# Patient Record
Sex: Female | Born: 1970
Health system: Southern US, Community
[De-identification: ages and names within clinical notes are randomized; demographics above are authoritative.]

## PROBLEM LIST (undated history)

## (undated) DIAGNOSIS — N189 Chronic kidney disease, unspecified: Secondary | ICD-10-CM

## (undated) DIAGNOSIS — R51 Headache: Secondary | ICD-10-CM

## (undated) DIAGNOSIS — R519 Headache, unspecified: Secondary | ICD-10-CM

## (undated) DIAGNOSIS — T7840XA Allergy, unspecified, initial encounter: Secondary | ICD-10-CM

## (undated) DIAGNOSIS — D649 Anemia, unspecified: Secondary | ICD-10-CM

## (undated) HISTORY — DX: Allergy, unspecified, initial encounter: T78.40XA

## (undated) HISTORY — PX: KIDNEY DONATION: SHX685

---

## 1999-09-01 ENCOUNTER — Emergency Department (HOSPITAL_COMMUNITY): Admission: EM | Admit: 1999-09-01 | Discharge: 1999-09-01 | Payer: Self-pay | Admitting: Emergency Medicine

## 2000-02-12 ENCOUNTER — Emergency Department (HOSPITAL_COMMUNITY): Admission: EM | Admit: 2000-02-12 | Discharge: 2000-02-12 | Payer: Self-pay | Admitting: Emergency Medicine

## 2000-02-12 ENCOUNTER — Encounter: Payer: Self-pay | Admitting: Emergency Medicine

## 2003-01-18 ENCOUNTER — Other Ambulatory Visit: Admission: RE | Admit: 2003-01-18 | Discharge: 2003-01-18 | Payer: Self-pay | Admitting: Obstetrics and Gynecology

## 2003-01-21 ENCOUNTER — Encounter: Payer: Self-pay | Admitting: Obstetrics and Gynecology

## 2003-01-21 ENCOUNTER — Ambulatory Visit (HOSPITAL_COMMUNITY): Admission: RE | Admit: 2003-01-21 | Discharge: 2003-01-21 | Payer: Self-pay | Admitting: Obstetrics and Gynecology

## 2004-07-18 ENCOUNTER — Emergency Department (HOSPITAL_COMMUNITY): Admission: EM | Admit: 2004-07-18 | Discharge: 2004-07-18 | Payer: Self-pay | Admitting: Emergency Medicine

## 2006-02-20 DIAGNOSIS — D229 Melanocytic nevi, unspecified: Secondary | ICD-10-CM

## 2006-02-20 HISTORY — DX: Melanocytic nevi, unspecified: D22.9

## 2008-08-31 ENCOUNTER — Emergency Department (HOSPITAL_COMMUNITY): Admission: EM | Admit: 2008-08-31 | Discharge: 2008-08-31 | Payer: Self-pay | Admitting: Emergency Medicine

## 2009-04-15 DIAGNOSIS — N189 Chronic kidney disease, unspecified: Secondary | ICD-10-CM

## 2009-04-15 HISTORY — DX: Chronic kidney disease, unspecified: N18.9

## 2010-03-22 ENCOUNTER — Encounter
Admission: RE | Admit: 2010-03-22 | Discharge: 2010-03-22 | Payer: Self-pay | Source: Home / Self Care | Attending: Family Medicine | Admitting: Family Medicine

## 2010-05-30 ENCOUNTER — Other Ambulatory Visit: Payer: Self-pay | Admitting: Family Medicine

## 2010-05-30 DIAGNOSIS — R911 Solitary pulmonary nodule: Secondary | ICD-10-CM

## 2010-06-20 ENCOUNTER — Ambulatory Visit
Admission: RE | Admit: 2010-06-20 | Discharge: 2010-06-20 | Disposition: A | Discharge status: Home / Self Care | Payer: No Typology Code available for payment source | Source: Ambulatory Visit | Attending: Family Medicine | Admitting: Family Medicine

## 2010-06-20 DIAGNOSIS — R911 Solitary pulmonary nodule: Secondary | ICD-10-CM

## 2013-01-28 ENCOUNTER — Other Ambulatory Visit: Payer: Self-pay | Admitting: Obstetrics and Gynecology

## 2013-01-28 ENCOUNTER — Other Ambulatory Visit: Payer: Self-pay | Admitting: *Deleted

## 2013-01-28 DIAGNOSIS — R928 Other abnormal and inconclusive findings on diagnostic imaging of breast: Secondary | ICD-10-CM

## 2013-02-11 ENCOUNTER — Ambulatory Visit
Admission: RE | Admit: 2013-02-11 | Discharge: 2013-02-11 | Disposition: A | Discharge status: Home / Self Care | Payer: Federal, State, Local not specified - PPO | Source: Ambulatory Visit | Attending: Obstetrics and Gynecology | Admitting: Obstetrics and Gynecology

## 2013-02-11 ENCOUNTER — Other Ambulatory Visit: Payer: Self-pay | Admitting: Obstetrics and Gynecology

## 2013-02-11 DIAGNOSIS — R928 Other abnormal and inconclusive findings on diagnostic imaging of breast: Secondary | ICD-10-CM

## 2013-02-17 ENCOUNTER — Ambulatory Visit
Admission: RE | Admit: 2013-02-17 | Discharge: 2013-02-17 | Disposition: A | Discharge status: Home / Self Care | Payer: Federal, State, Local not specified - PPO | Source: Ambulatory Visit | Attending: Obstetrics and Gynecology | Admitting: Obstetrics and Gynecology

## 2013-02-17 ENCOUNTER — Other Ambulatory Visit: Payer: Self-pay | Admitting: Obstetrics and Gynecology

## 2013-02-17 DIAGNOSIS — R928 Other abnormal and inconclusive findings on diagnostic imaging of breast: Secondary | ICD-10-CM

## 2013-11-04 IMAGING — MG MM DIAGNOSTIC LTD RIGHT
2 series · 2 of 2 positions shown · non-contrast
Comparison: Previous examinations, including the screening
mammogram dated 01/19/2013.

CLINICAL DATA: Possible right breast mass at recent screening
mammography.

EXAM:
DIGITAL DIAGNOSTIC  RIGHT MAMMOGRAM
ULTRASOUND RIGHT BREAST

[R MLO]
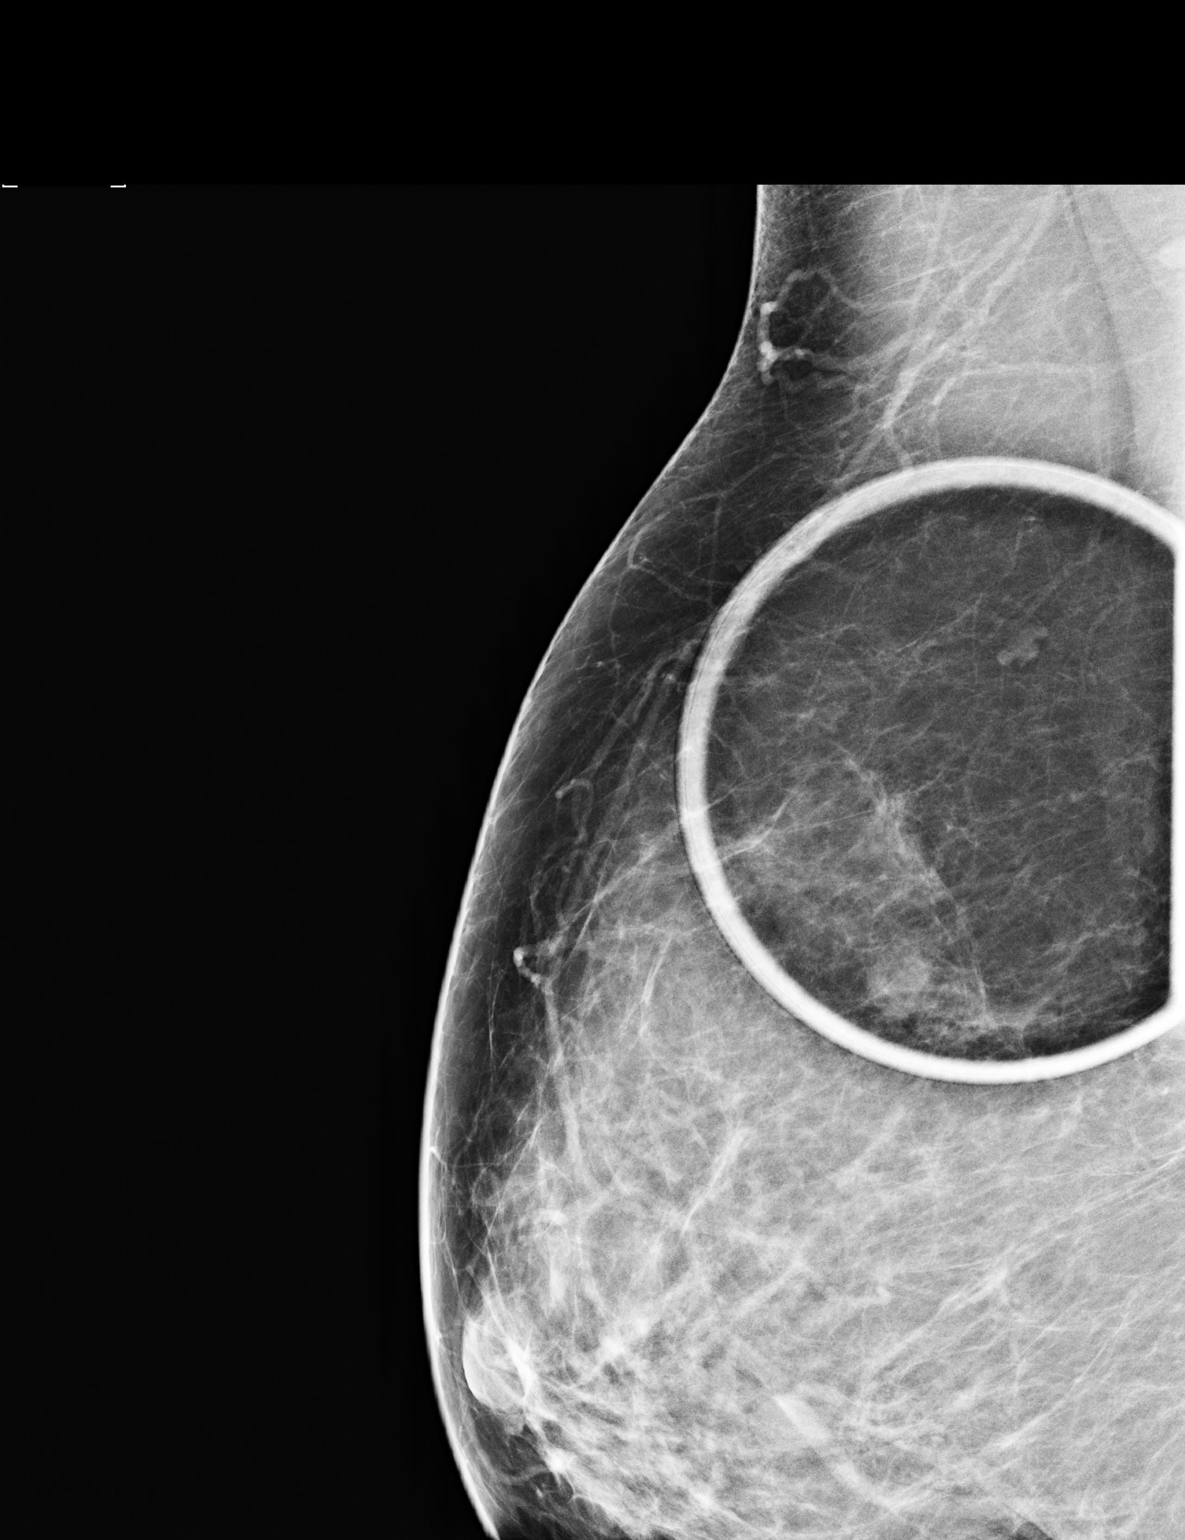

[R CC]
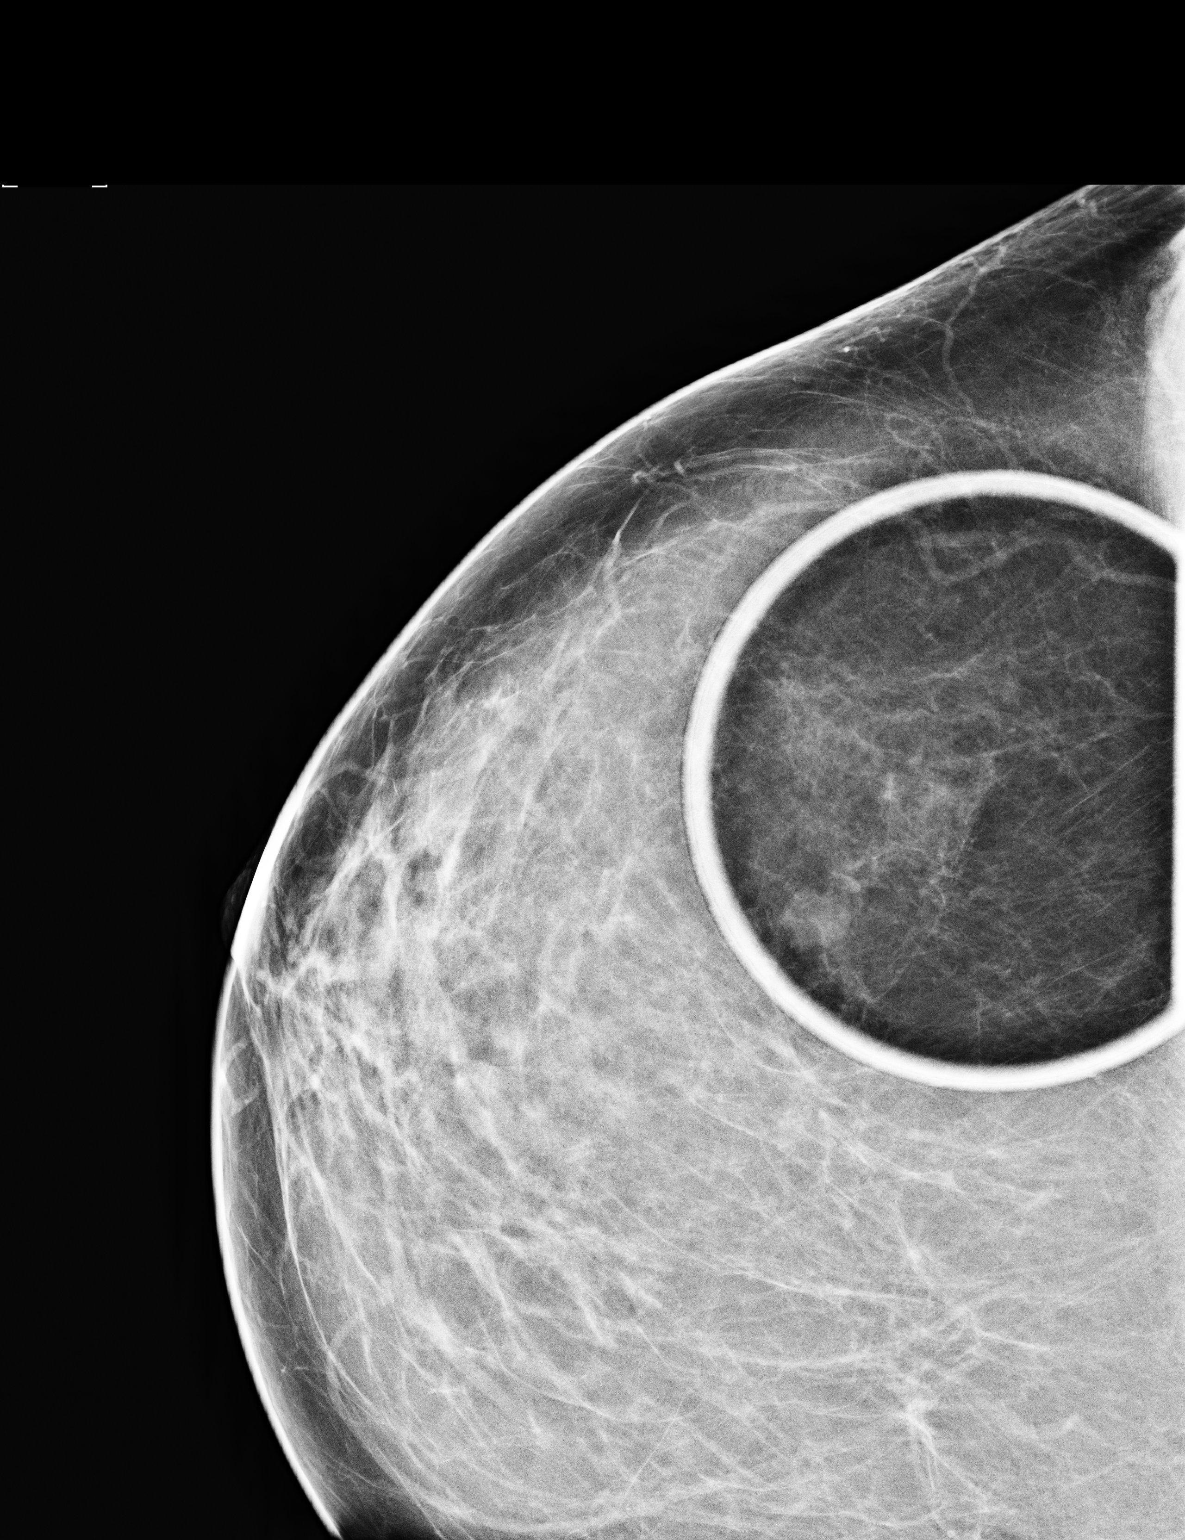

[2 of 2 positions shown; findings below may reference images not displayed]

ACR Breast Density Category b: There are scattered areas of
fibroglandular density.
FINDINGS: Spot compression views of the right breast confirm a small, oval,
partially obscured mass deep in the upper-outer quadrant of the
breast. Some margins are circumscribed and some are obscured.

On physical exam no mass is palpable in the upper-outer right
breast.

Ultrasound is performed, showing an 8 x 5 x 3 mm oval, hypoechoic,
horizontally oriented mass deep in the 10:30 o'clock position of the
right breast, 4 cm from the nipple. This has minimally ill-defined
margins with no increased through transmission of sound.
IMPRESSION: 8 x 5 x 3 mm probable mildly complex cyst or fibroadenoma in the
10:30 o'clock position of the right breast, as described above. The
options of 6 month followup ultrasound and ultrasound-guided core
needle biopsy/cyst aspiration were discussed with the patient. She
would prefer biopsy/cyst aspiration. This has been scheduled on
02/17/2013 at 1 p.m.

RECOMMENDATION:
Right breast ultrasound-guided core needle biopsies/cyst aspiration
(scheduled).

I have discussed the findings and recommendations with the patient.
Results were also provided in writing at the conclusion of the
visit. If applicable, a reminder letter will be sent to the patient
regarding the next appointment.

BI-RADS CATEGORY  4: Suspicious abnormality - biopsy should be
considered.

## 2013-11-04 IMAGING — US US BREAST*R*
1 series · 6 of 6 positions shown · non-contrast
Comparison: Previous examinations, including the screening
mammogram dated 01/19/2013.

CLINICAL DATA: Possible right breast mass at recent screening
mammography.

EXAM:
DIGITAL DIAGNOSTIC  RIGHT MAMMOGRAM
ULTRASOUND RIGHT BREAST

[Series 1: us breast*right* · 6 of 6 slices shown]
[im 1/6]
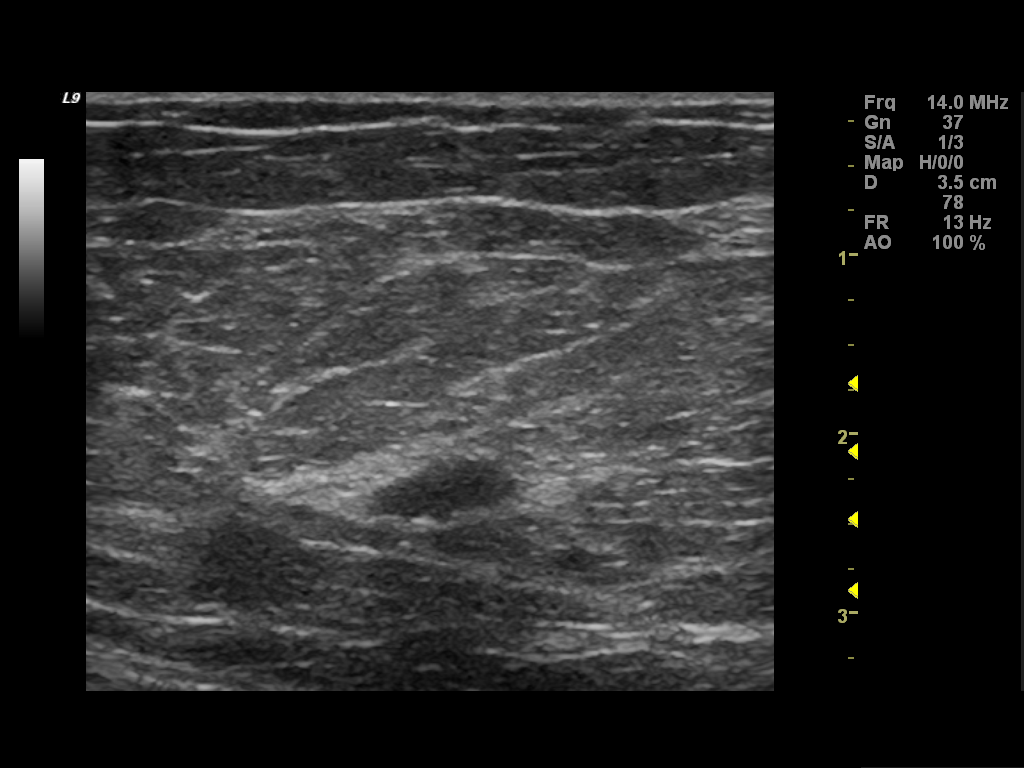
[im 2/6]
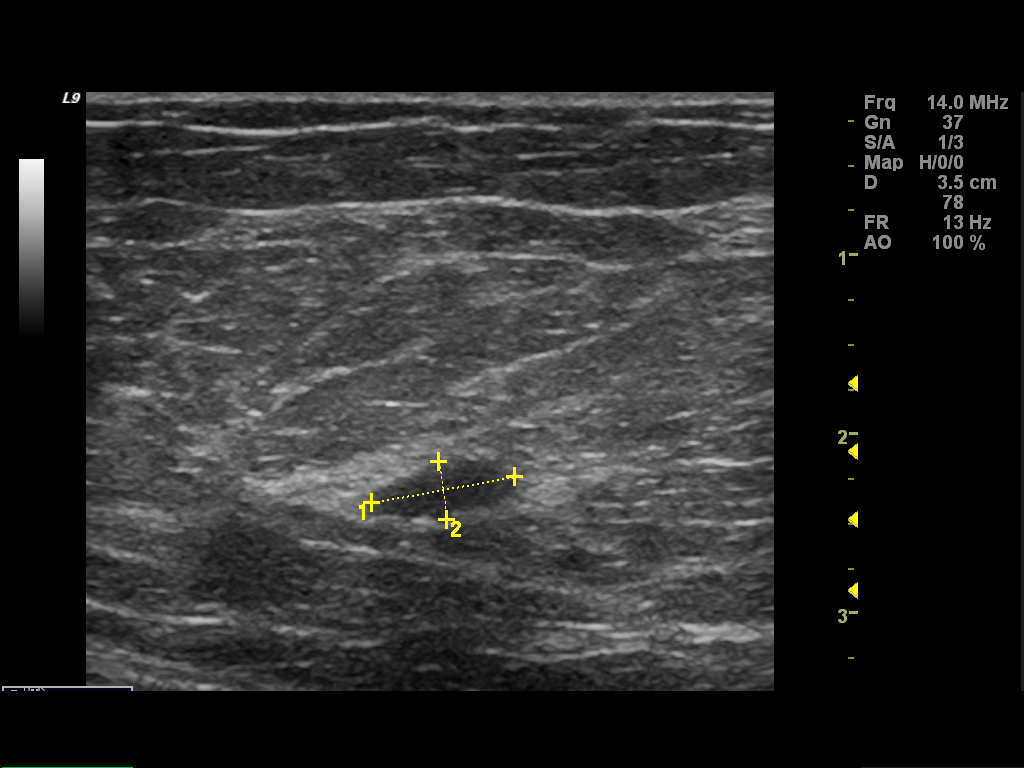
[im 3/6]
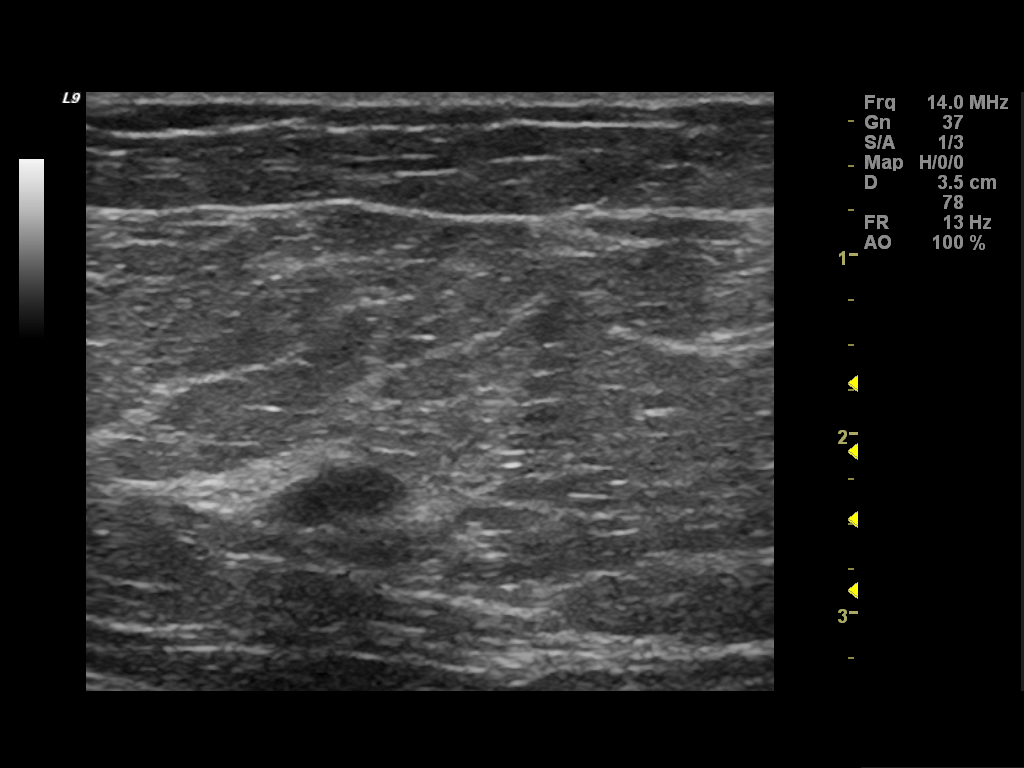
[im 4/6]
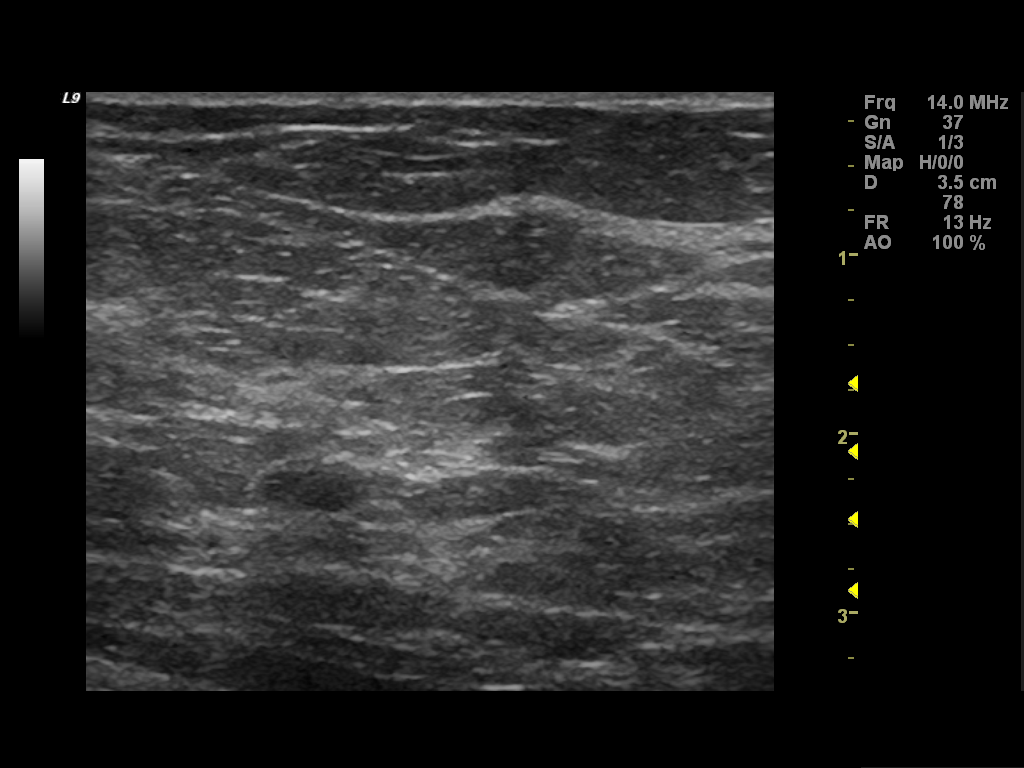
[im 5/6]
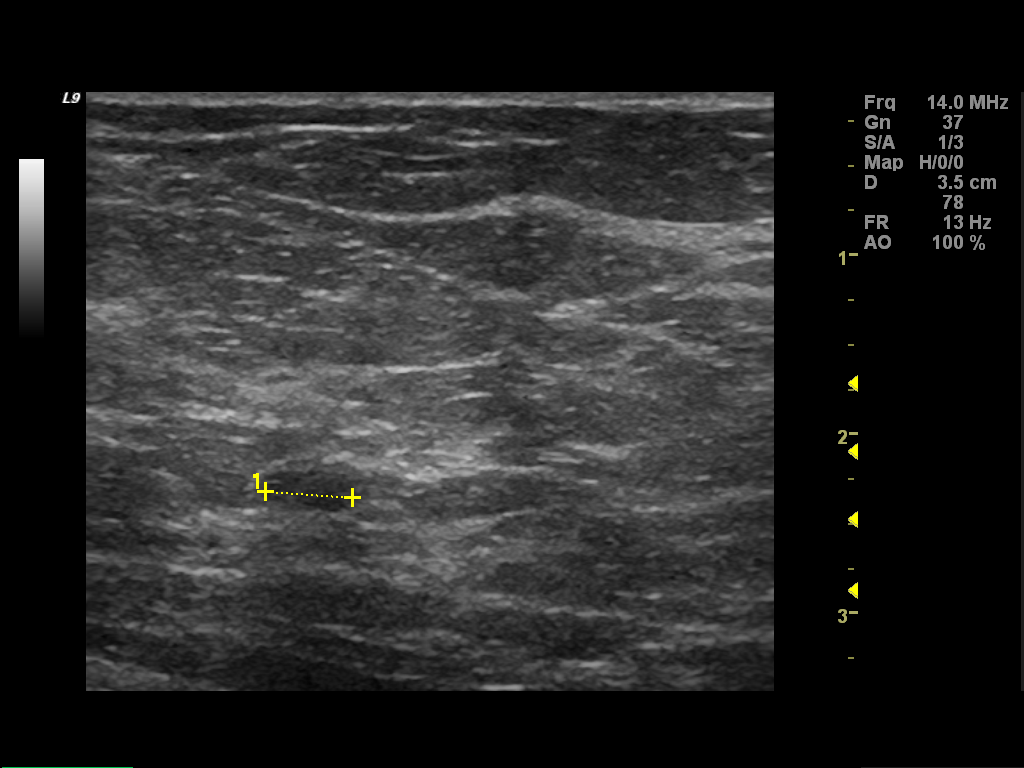
[im 6/6]
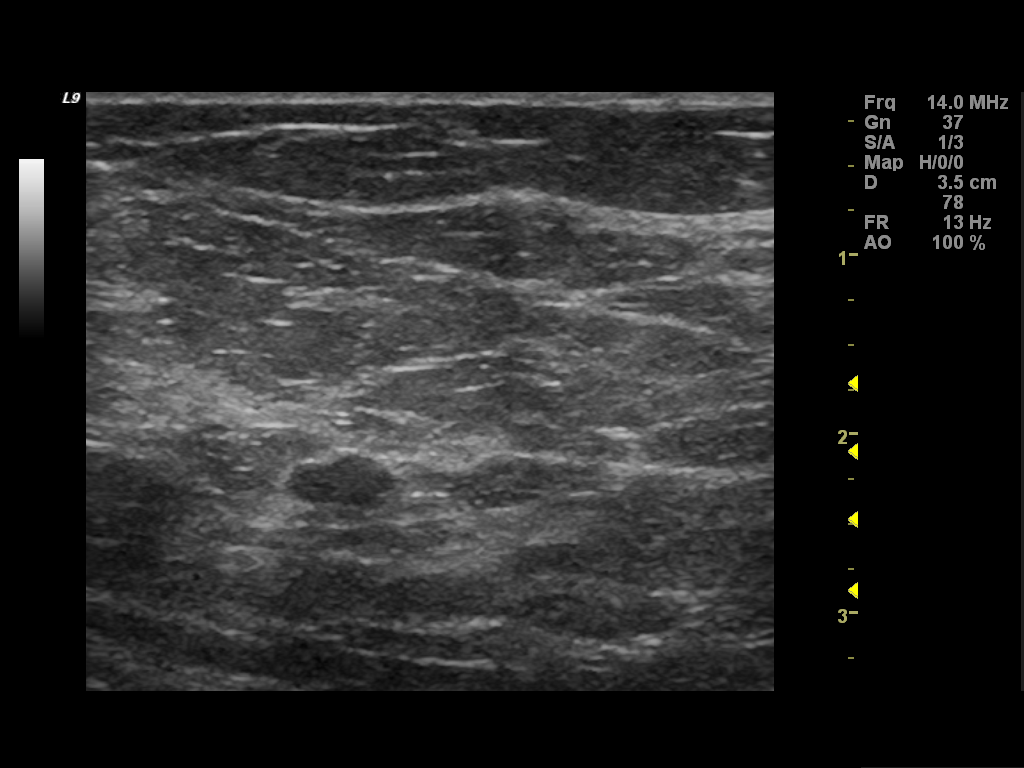

[6 of 6 positions shown; findings below may reference images not displayed]

ACR Breast Density Category b: There are scattered areas of
fibroglandular density.
FINDINGS: Spot compression views of the right breast confirm a small, oval,
partially obscured mass deep in the upper-outer quadrant of the
breast. Some margins are circumscribed and some are obscured.

On physical exam no mass is palpable in the upper-outer right
breast.

Ultrasound is performed, showing an 8 x 5 x 3 mm oval, hypoechoic,
horizontally oriented mass deep in the 10:30 o'clock position of the
right breast, 4 cm from the nipple. This has minimally ill-defined
margins with no increased through transmission of sound.
IMPRESSION: 8 x 5 x 3 mm probable mildly complex cyst or fibroadenoma in the
10:30 o'clock position of the right breast, as described above. The
options of 6 month followup ultrasound and ultrasound-guided core
needle biopsy/cyst aspiration were discussed with the patient. She
would prefer biopsy/cyst aspiration. This has been scheduled on
02/17/2013 at 1 p.m.

RECOMMENDATION:
Right breast ultrasound-guided core needle biopsies/cyst aspiration
(scheduled).

I have discussed the findings and recommendations with the patient.
Results were also provided in writing at the conclusion of the
visit. If applicable, a reminder letter will be sent to the patient
regarding the next appointment.

BI-RADS CATEGORY  4: Suspicious abnormality - biopsy should be
considered.

## 2013-11-10 IMAGING — US US RT BREAST BIOPSY
1 series · 13 of 16 positions shown · non-contrast
Comparison: Previous exams.

ADDENDUM:
Pathology revealed a fibroadenoma in the right breast. This was
found to be concordant by Dr. Omri Mino. Pathology was relayed to
the patient by telephone. The patient reported doing well after the
biopsy. Post biopsy instructions were reviewed and her questions
were answered. She was encouraged to call The [REDACTED] for any additional concerns. She was asked to
return to [HOSPITAL] for screening mammography in 1 year.

Pathology results were reported by Jori Live RN, BSN on [REDACTED]AL DATA:  The patient presents for ultrasound-guided core
biopsy of right breast mass.
EXAM:
US BREAST BX Gargi 1ST LESION IMG BX SPEC US GUIDE*R*

[Series 1: superficial breast · 13 of 16 slices shown]
[im 1/16]
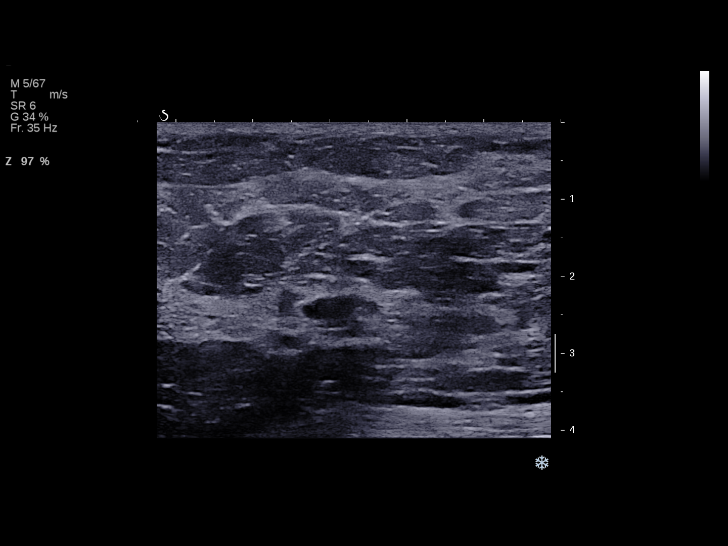
[im 2/16]
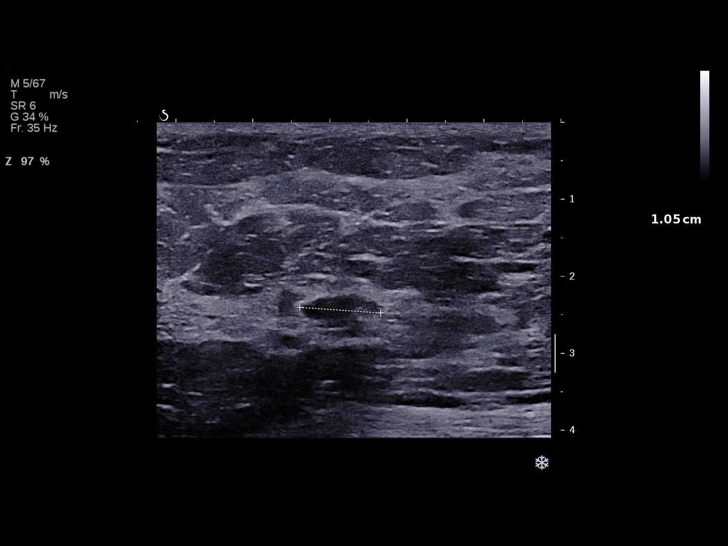
[im 4/16]
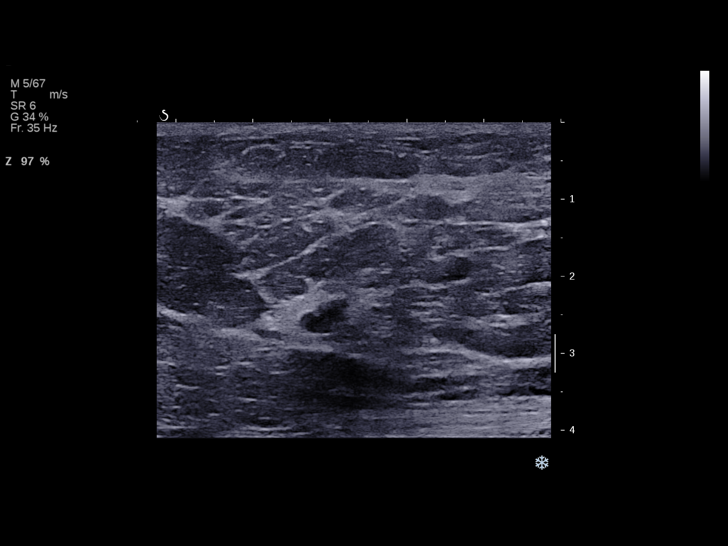
[im 5/16]
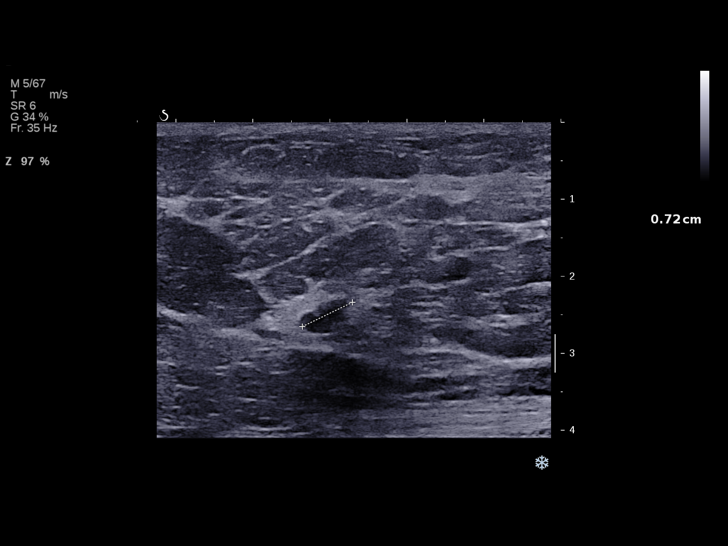
[im 6/16]
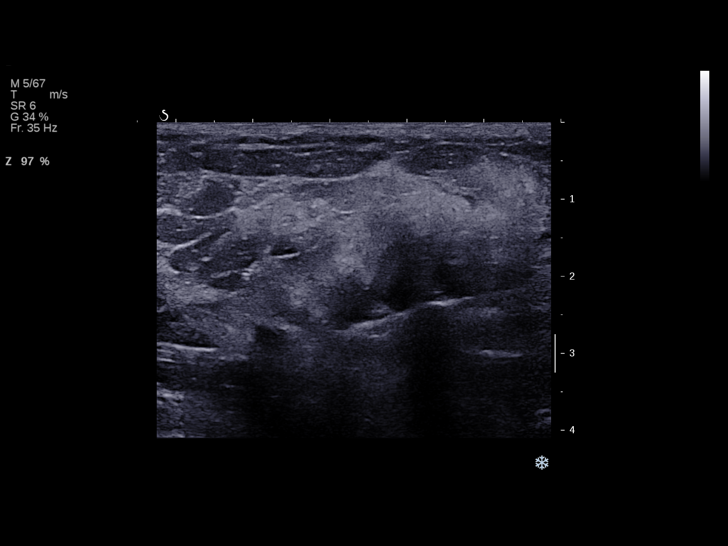
[im 7/16]
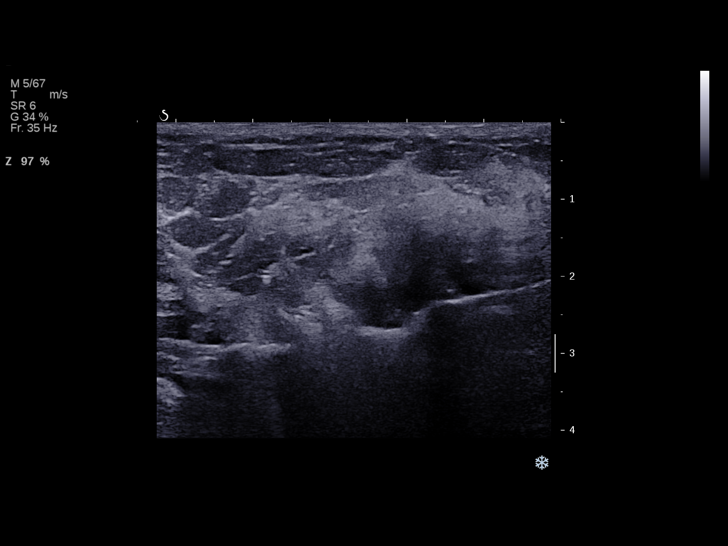
[im 9/16]
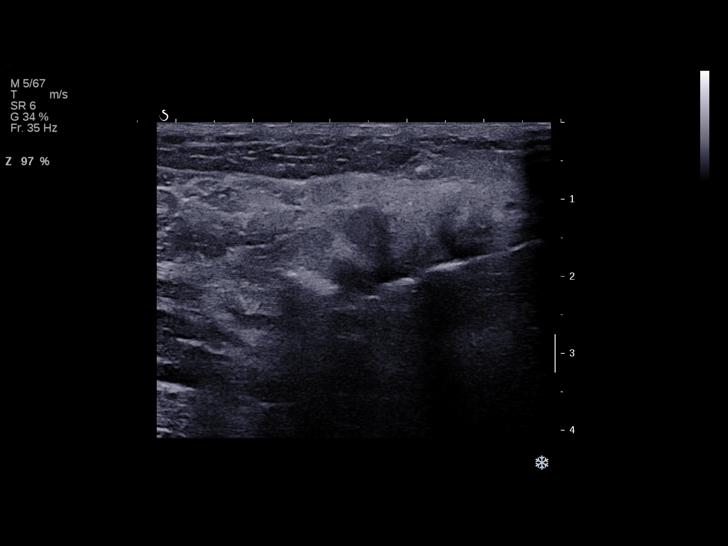
[im 10/16]
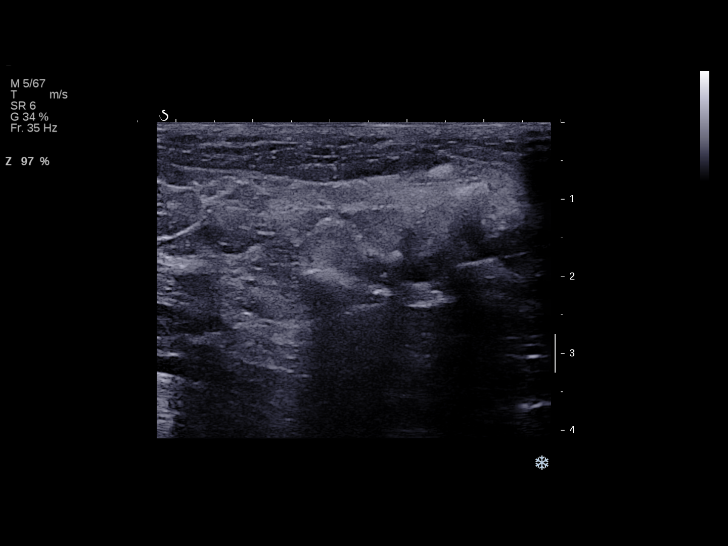
[im 11/16]
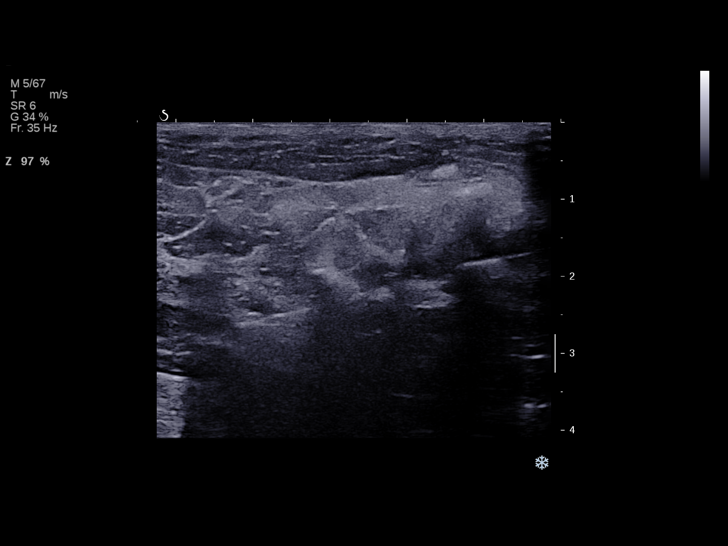
[im 12/16]
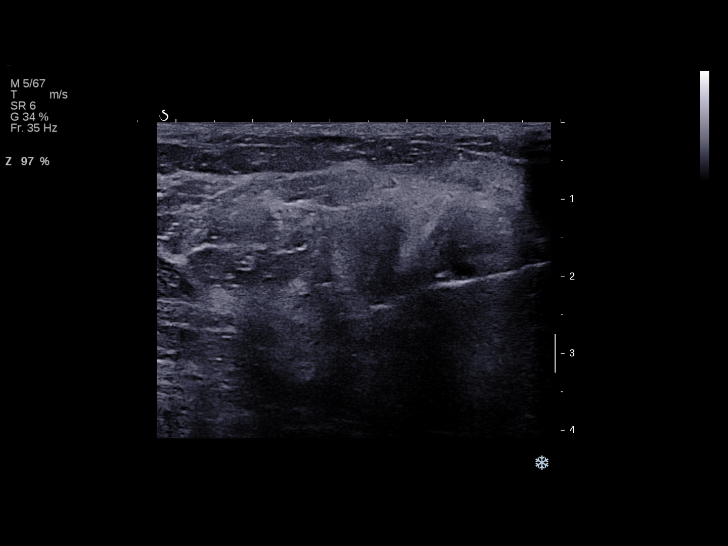
[im 13/16]
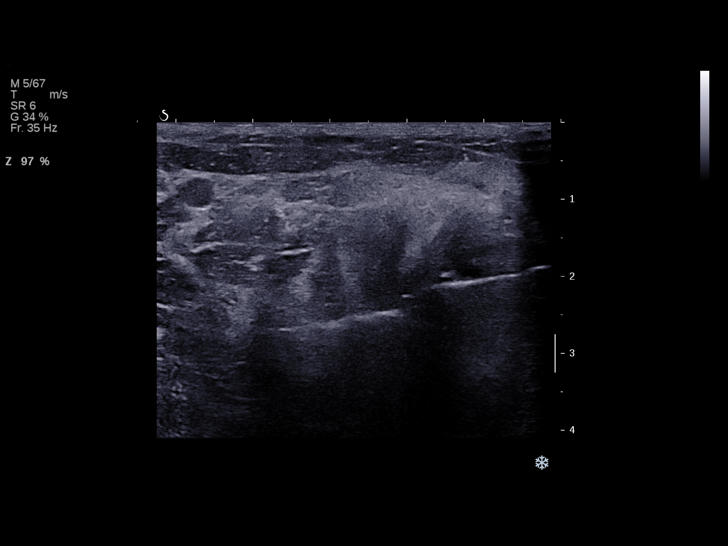
[im 15/16]
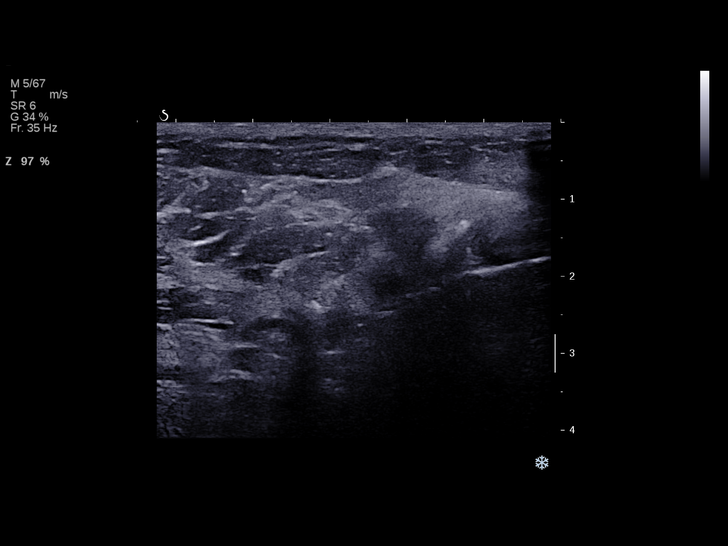
[im 16/16]
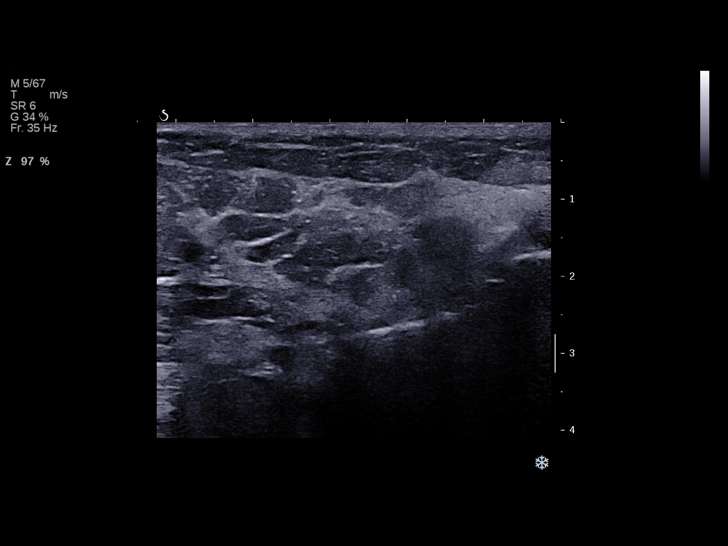

[13 of 16 positions shown; findings below may reference images not displayed]

PROCEDURE:
I met with the patient and we discussed the procedure of
ultrasound-guided biopsy, including benefits and alternatives. We
discussed the high likelihood of a successful procedure. We
discussed the risks of the procedure including infection, bleeding,
tissue injury, clip migration, and inadequate sampling. Informed
written consent was given. The usual time-out protocol was performed
immediately prior to the procedure.

Using sterile technique and 2% Lidocaine as local anesthetic, under
direct ultrasound visualization, a 12 gauge vacuum-assisteddevice
was used to perform biopsy of mass in the 11 o'clock location of the
right breast using a medial approach. At the conclusion of the
procedure, a top hat shaped tissue marker clip was deployed into the
biopsy cavity. Follow-up 2-view mammogram was performed and dictated
separately.
IMPRESSION: Ultrasound-guided biopsy of right breast mass. No apparent
complications.

## 2014-07-22 ENCOUNTER — Ambulatory Visit (INDEPENDENT_AMBULATORY_CARE_PROVIDER_SITE_OTHER): Payer: Federal, State, Local not specified - PPO | Admitting: Internal Medicine

## 2014-07-22 VITALS — BP 132/94 | HR 108 | Temp 98.2°F | Resp 17 | Ht 63.0 in | Wt 232.4 lb

## 2014-07-22 DIAGNOSIS — G43829 Menstrual migraine, not intractable, without status migrainosus: Secondary | ICD-10-CM | POA: Diagnosis not present

## 2014-07-22 DIAGNOSIS — N943 Premenstrual tension syndrome: Secondary | ICD-10-CM | POA: Diagnosis not present

## 2014-07-22 DIAGNOSIS — Z905 Acquired absence of kidney: Secondary | ICD-10-CM | POA: Diagnosis not present

## 2014-07-22 DIAGNOSIS — G43909 Migraine, unspecified, not intractable, without status migrainosus: Secondary | ICD-10-CM | POA: Insufficient documentation

## 2014-07-22 DIAGNOSIS — Z23 Encounter for immunization: Secondary | ICD-10-CM | POA: Diagnosis not present

## 2014-07-22 DIAGNOSIS — Z0000 Encounter for general adult medical examination without abnormal findings: Secondary | ICD-10-CM

## 2014-07-22 LAB — POCT CBC
GRANULOCYTE PERCENT: 65.6 % (ref 37–80)
HCT, POC: 39 % (ref 37.7–47.9)
Hemoglobin: 12.3 g/dL (ref 12.2–16.2)
Lymph, poc: 3 (ref 0.6–3.4)
MCH: 25 pg — AB (ref 27–31.2)
MCHC: 31.6 g/dL — AB (ref 31.8–35.4)
MCV: 79.1 fL — AB (ref 80–97)
MID (cbc): 0.8 (ref 0–0.9)
MPV: 8.5 fL (ref 0–99.8)
PLATELET COUNT, POC: 318 10*3/uL (ref 142–424)
POC Granulocyte: 7.2 — AB (ref 2–6.9)
POC LYMPH %: 27.3 % (ref 10–50)
POC MID %: 7.1 %M (ref 0–12)
RBC: 4.93 M/uL (ref 4.04–5.48)
RDW, POC: 15.8 %
WBC: 11 10*3/uL — AB (ref 4.6–10.2)

## 2014-07-22 LAB — POCT URINALYSIS DIPSTICK
BILIRUBIN UA: NEGATIVE
Glucose, UA: NEGATIVE
Ketones, UA: NEGATIVE
Leukocytes, UA: NEGATIVE
NITRITE UA: NEGATIVE
Protein, UA: NEGATIVE
RBC UA: NEGATIVE
Spec Grav, UA: 1.015
Urobilinogen, UA: 0.2
pH, UA: 5.5

## 2014-07-22 LAB — COMPREHENSIVE METABOLIC PANEL
ALT: 53 U/L — ABNORMAL HIGH (ref 0–35)
AST: 44 U/L — ABNORMAL HIGH (ref 0–37)
Albumin: 4.2 g/dL (ref 3.5–5.2)
Alkaline Phosphatase: 88 U/L (ref 39–117)
BILIRUBIN TOTAL: 0.3 mg/dL (ref 0.2–1.2)
BUN: 19 mg/dL (ref 6–23)
CALCIUM: 8.9 mg/dL (ref 8.4–10.5)
CO2: 26 mEq/L (ref 19–32)
CREATININE: 0.97 mg/dL (ref 0.50–1.10)
Chloride: 101 mEq/L (ref 96–112)
Glucose, Bld: 92 mg/dL (ref 70–99)
Potassium: 4.5 mEq/L (ref 3.5–5.3)
SODIUM: 137 meq/L (ref 135–145)
Total Protein: 6.9 g/dL (ref 6.0–8.3)

## 2014-07-22 LAB — LIPID PANEL
CHOLESTEROL: 261 mg/dL — AB (ref 0–200)
HDL: 65 mg/dL (ref >=46)
LDL Cholesterol: 163 mg/dL — ABNORMAL HIGH (ref 0–99)
TRIGLYCERIDES: 166 mg/dL — AB (ref <150)
Total CHOL/HDL Ratio: 4 Ratio
VLDL: 33 mg/dL (ref 0–40)

## 2014-07-22 LAB — POCT UA - MICROSCOPIC ONLY
Bacteria, U Microscopic: NEGATIVE
Casts, Ur, LPF, POC: NEGATIVE
Crystals, Ur, HPF, POC: NEGATIVE
MUCUS UA: NEGATIVE
RBC, urine, microscopic: NEGATIVE
YEAST UA: NEGATIVE

## 2014-07-22 LAB — TSH: TSH: 2.43 u[IU]/mL (ref 0.350–4.500)

## 2014-07-22 NOTE — Progress Notes (Signed)
   Subjective:    Patient ID: Pamela Ortega, female    DOB: 07/17/1970, 44 y.o.   MRN: [id number]  HPI I, Pamela Ser R.T. (R), am scribing for Dr. Lou Miner.  Pamela Ortega is a 44 y.o. Female who presents today requesting a physical exam. She has brought in her paperwork needing to be filled out.    Review of Systems     Objective:   Physical Exam        Assessment & Plan:

## 2014-07-22 NOTE — Patient Instructions (Signed)
DASH Eating Plan °DASH stands for "Dietary Approaches to Stop Hypertension." The DASH eating plan is a healthy eating plan that has been shown to reduce high blood pressure (hypertension). Additional health benefits may include reducing the risk of type 2 diabetes mellitus, heart disease, and stroke. The DASH eating plan may also help with weight loss. °WHAT DO I NEED TO KNOW ABOUT THE DASH EATING PLAN? °For the DASH eating plan, you will follow these general guidelines: °· Choose foods with a percent daily value for sodium of less than 5% (as listed on the food label). °· Use salt-free seasonings or herbs instead of table salt or sea salt. °· Check with your health care provider or pharmacist before using salt substitutes. °· Eat lower-sodium products, often labeled as "lower sodium" or "no salt added." °· Eat fresh foods. °· Eat more vegetables, fruits, and low-fat dairy products. °· Choose whole grains. Look for the word "whole" as the first word in the ingredient list. °· Choose fish and skinless chicken or turkey more often than red meat. Limit fish, poultry, and meat to 6 oz (170 g) each day. °· Limit sweets, desserts, sugars, and sugary drinks. °· Choose heart-healthy fats. °· Limit cheese to 1 oz (28 g) per day. °· Eat more home-cooked food and less restaurant, buffet, and fast food. °· Limit fried foods. °· Cook foods using methods other than frying. °· Limit canned vegetables. If you do use them, rinse them well to decrease the sodium. °· When eating at a restaurant, ask that your food be prepared with less salt, or no salt if possible. °WHAT FOODS CAN I EAT? °Seek help from a dietitian for individual calorie needs. °Grains °Whole grain or whole wheat bread. Brown rice. Whole grain or whole wheat pasta. Quinoa, bulgur, and whole grain cereals. Low-sodium cereals. Corn or whole wheat flour tortillas. Whole grain cornbread. Whole grain crackers. Low-sodium crackers. °Vegetables °Fresh or frozen vegetables  (raw, steamed, roasted, or grilled). Low-sodium or reduced-sodium tomato and vegetable juices. Low-sodium or reduced-sodium tomato sauce and paste. Low-sodium or reduced-sodium canned vegetables.  °Fruits °All fresh, canned (in natural juice), or frozen fruits. °Meat and Other Protein Products °Ground beef (85% or leaner), grass-fed beef, or beef trimmed of fat. Skinless chicken or turkey. Ground chicken or turkey. Pork trimmed of fat. All fish and seafood. Eggs. Dried beans, peas, or lentils. Unsalted nuts and seeds. Unsalted canned beans. °Dairy °Low-fat dairy products, such as skim or 1% milk, 2% or reduced-fat cheeses, low-fat ricotta or cottage cheese, or plain low-fat yogurt. Low-sodium or reduced-sodium cheeses. °Fats and Oils °Tub margarines without trans fats. Light or reduced-fat mayonnaise and salad dressings (reduced sodium). Avocado. Safflower, olive, or canola oils. Natural peanut or almond butter. °Other °Unsalted popcorn and pretzels. °The items listed above may not be a complete list of recommended foods or beverages. Contact your dietitian for more options. °WHAT FOODS ARE NOT RECOMMENDED? °Grains °White bread. White pasta. White rice. Refined cornbread. Bagels and croissants. Crackers that contain trans fat. °Vegetables °Creamed or fried vegetables. Vegetables in a cheese sauce. Regular canned vegetables. Regular canned tomato sauce and paste. Regular tomato and vegetable juices. °Fruits °Dried fruits. Canned fruit in light or heavy syrup. Fruit juice. °Meat and Other Protein Products °Fatty cuts of meat. Ribs, chicken wings, bacon, sausage, bologna, salami, chitterlings, fatback, hot dogs, bratwurst, and packaged luncheon meats. Salted nuts and seeds. Canned beans with salt. °Dairy °Whole or 2% milk, cream, half-and-half, and cream cheese. Whole-fat or sweetened yogurt. Full-fat   cheeses or blue cheese. Nondairy creamers and whipped toppings. Processed cheese, cheese spreads, or cheese  curds. °Condiments °Onion and garlic salt, seasoned salt, table salt, and sea salt. Canned and packaged gravies. Worcestershire sauce. Tartar sauce. Barbecue sauce. Teriyaki sauce. Soy sauce, including reduced sodium. Steak sauce. Fish sauce. Oyster sauce. Cocktail sauce. Horseradish. Ketchup and mustard. Meat flavorings and tenderizers. Bouillon cubes. Hot sauce. Tabasco sauce. Marinades. Taco seasonings. Relishes. °Fats and Oils °Butter, stick margarine, lard, shortening, ghee, and bacon fat. Coconut, palm kernel, or palm oils. Regular salad dressings. °Other °Pickles and olives. Salted popcorn and pretzels. °The items listed above may not be a complete list of foods and beverages to avoid. Contact your dietitian for more information. °WHERE CAN I FIND MORE INFORMATION? °[employer], [employer], [employer]: www.nhlbi.nih.gov/health/health-topics/topics/dash/ °Document Released: 03/21/2011 Document Revised: 08/16/2013 Document Reviewed: 02/03/2013 °Exit[employer]® Patient Information ©2015 [employer]. This information is not intended to replace advice given to you by your health care provider. Make sure you discuss any questions you have with your health care provider. ° °

## 2014-07-22 NOTE — Progress Notes (Signed)
Subjective:    Patient ID: Pamela Ortega, female    DOB: 07-25-1970, 44 y.o.   MRN: [id number]  HPI Feels great. Hx of nephrectomy for kidney donation.   Review of Systems  Constitutional: Negative.   Eyes: Negative.   Respiratory: Negative.   Cardiovascular: Negative.   Gastrointestinal: Negative.   Endocrine: Negative.   Genitourinary: Negative.   Musculoskeletal: Negative.   Skin: Negative.   Allergic/Immunologic: Positive for environmental allergies.  Neurological: Positive for headaches. Negative for dizziness, tremors, seizures, syncope, facial asymmetry, speech difficulty, light-headedness and numbness.  Hematological: Negative.   Psychiatric/Behavioral: Negative.        Objective:   Physical Exam  Constitutional: She is oriented to person, place, and time. She appears well-developed and well-nourished. No distress.  HENT:  Head: Normocephalic.  Right Ear: External ear normal.  Left Ear: External ear normal.  Nose: Nose normal.  Mouth/Throat: Oropharynx is clear and moist.  Eyes: Conjunctivae and EOM are normal. Pupils are equal, round, and reactive to light.  Neck: Normal range of motion. Neck supple. No tracheal deviation present. No thyromegaly present.  Cardiovascular: Normal rate, regular rhythm, normal heart sounds and intact distal pulses.   No murmur heard. Pulmonary/Chest: Effort normal and breath sounds normal.  Abdominal: Soft. Bowel sounds are normal. She exhibits no mass. There is no tenderness.  Musculoskeletal: Normal range of motion.  Neurological: She is alert and oriented to person, place, and time. Coordination normal.  Skin: No rash noted.  Psychiatric: She has a normal mood and affect. Her behavior is normal. Thought content normal.  Vitals reviewed.  Results for orders placed or performed in visit on 07/22/14  POCT CBC  Result Value Ref Range   WBC 11.0 (A) 4.6 - 10.2 K/uL   Lymph, poc 3.0 0.6 - 3.4   POC LYMPH PERCENT 27.3 10 - 50  %L   MID (cbc) 0.8 0 - 0.9   POC MID % 7.1 0 - 12 %M   POC Granulocyte 7.2 (A) 2 - 6.9   Granulocyte percent 65.6 37 - 80 %G   RBC 4.93 4.04 - 5.48 M/uL   Hemoglobin 12.3 12.2 - 16.2 g/dL   HCT, POC 39.0 37.7 - 47.9 %   MCV 79.1 (A) 80 - 97 fL   MCH, POC 25.0 (A) 27 - 31.2 pg   MCHC 31.6 (A) 31.8 - 35.4 g/dL   RDW, POC 15.8 %   Platelet Count, POC 318 142 - 424 K/uL   MPV 8.5 0 - 99.8 fL  POCT UA - Microscopic Only  Result Value Ref Range   WBC, Ur, HPF, POC 0-2    RBC, urine, microscopic neg    Bacteria, U Microscopic neg    Mucus, UA neg    Epithelial cells, urine per micros 1-4    Crystals, Ur, HPF, POC neg    Casts, Ur, LPF, POC neg    Yeast, UA neg   POCT urinalysis dipstick  Result Value Ref Range   Color, UA yellow    Clarity, UA clear    Glucose, UA neg    Bilirubin, UA neg    Ketones, UA neg    Spec Grav, UA 1.015    Blood, UA neg    pH, UA 5.5    Protein, UA neg    Urobilinogen, UA 0.2    Nitrite, UA neg    Leukocytes, UA Negative           Assessment & Plan:  Tdap given

## 2014-07-26 ENCOUNTER — Encounter: Payer: Self-pay | Admitting: Family Medicine

## 2015-12-26 ENCOUNTER — Encounter (HOSPITAL_COMMUNITY): Payer: Self-pay | Admitting: *Deleted

## 2015-12-28 ENCOUNTER — Other Ambulatory Visit: Payer: Self-pay | Admitting: Obstetrics and Gynecology

## 2016-01-04 ENCOUNTER — Ambulatory Visit (HOSPITAL_COMMUNITY)
Admission: RE | Admit: 2016-01-04 | Discharge: 2016-01-04 | Disposition: A | Discharge status: Home / Self Care | Payer: Federal, State, Local not specified - PPO | Source: Ambulatory Visit | Attending: Obstetrics and Gynecology | Admitting: Obstetrics and Gynecology

## 2016-01-04 ENCOUNTER — Ambulatory Visit (HOSPITAL_COMMUNITY): Payer: Federal, State, Local not specified - PPO | Admitting: Anesthesiology

## 2016-01-04 ENCOUNTER — Encounter (HOSPITAL_COMMUNITY)
Admission: RE | Disposition: A | Discharge status: Home / Self Care | Payer: Self-pay | Source: Ambulatory Visit | Attending: Obstetrics and Gynecology

## 2016-01-04 ENCOUNTER — Encounter (HOSPITAL_COMMUNITY): Payer: Self-pay | Admitting: Emergency Medicine

## 2016-01-04 DIAGNOSIS — N920 Excessive and frequent menstruation with regular cycle: Secondary | ICD-10-CM | POA: Insufficient documentation

## 2016-01-04 DIAGNOSIS — N840 Polyp of corpus uteri: Secondary | ICD-10-CM | POA: Diagnosis not present

## 2016-01-04 DIAGNOSIS — Z6841 Body Mass Index (BMI) 40.0 and over, adult: Secondary | ICD-10-CM | POA: Diagnosis not present

## 2016-01-04 HISTORY — DX: Headache, unspecified: R51.9

## 2016-01-04 HISTORY — DX: Headache: R51

## 2016-01-04 HISTORY — PX: DILITATION & CURRETTAGE/HYSTROSCOPY WITH NOVASURE ABLATION: SHX5568

## 2016-01-04 HISTORY — DX: Anemia, unspecified: D64.9

## 2016-01-04 HISTORY — DX: Chronic kidney disease, unspecified: N18.9

## 2016-01-04 LAB — CBC
HEMATOCRIT: 29.3 % — AB (ref 36.0–46.0)
HEMOGLOBIN: 8.9 g/dL — AB (ref 12.0–15.0)
MCH: 22 pg — ABNORMAL LOW (ref 26.0–34.0)
MCHC: 30.4 g/dL (ref 30.0–36.0)
MCV: 72.3 fL — AB (ref 78.0–100.0)
Platelets: 300 10*3/uL (ref 150–400)
RBC: 4.05 MIL/uL (ref 3.87–5.11)
RDW: 18.2 % — ABNORMAL HIGH (ref 11.5–15.5)
WBC: 9.1 10*3/uL (ref 4.0–10.5)

## 2016-01-04 SURGERY — DILATATION & CURETTAGE/HYSTEROSCOPY WITH NOVASURE ABLATION
Anesthesia: General | Site: Vagina

## 2016-01-04 MED ORDER — ONDANSETRON HCL 4 MG/2ML IJ SOLN
INTRAMUSCULAR | Status: AC
  Filled 2016-01-04: qty 2

## 2016-01-04 MED ORDER — DEXAMETHASONE SODIUM PHOSPHATE 10 MG/ML IJ SOLN
INTRAMUSCULAR | Status: DC | PRN
  Administered 2016-01-04: 4 mg via INTRAVENOUS

## 2016-01-04 MED ORDER — KETOROLAC TROMETHAMINE 30 MG/ML IJ SOLN
INTRAMUSCULAR | Status: DC | PRN
  Administered 2016-01-04: 30 mg via INTRAVENOUS

## 2016-01-04 MED ORDER — DEXAMETHASONE SODIUM PHOSPHATE 4 MG/ML IJ SOLN
INTRAMUSCULAR | Status: AC
  Filled 2016-01-04: qty 1

## 2016-01-04 MED ORDER — MEPERIDINE HCL 25 MG/ML IJ SOLN: 6.2500 mg | INTRAMUSCULAR | Status: DC | PRN

## 2016-01-04 MED ORDER — HYDROMORPHONE HCL 1 MG/ML IJ SOLN
INTRAMUSCULAR | Status: AC
  Administered 2016-01-04: 0.5 mg via INTRAVENOUS
  Filled 2016-01-04: qty 1

## 2016-01-04 MED ORDER — PROPOFOL 10 MG/ML IV BOLUS
INTRAVENOUS | Status: DC | PRN
  Administered 2016-01-04: 220 mg via INTRAVENOUS

## 2016-01-04 MED ORDER — MIDAZOLAM HCL 2 MG/2ML IJ SOLN
INTRAMUSCULAR | Status: DC | PRN
  Administered 2016-01-04: 2 mg via INTRAVENOUS

## 2016-01-04 MED ORDER — PROPOFOL 10 MG/ML IV BOLUS
INTRAVENOUS | Status: AC
  Filled 2016-01-04: qty 40

## 2016-01-04 MED ORDER — LACTATED RINGERS IV SOLN
INTRAVENOUS | Status: DC
  Administered 2016-01-04 (×2): via INTRAVENOUS

## 2016-01-04 MED ORDER — PROMETHAZINE HCL 25 MG/ML IJ SOLN
6.2500 mg | INTRAMUSCULAR | Status: AC | PRN
  Administered 2016-01-04 (×2): 6.25 mg via INTRAVENOUS

## 2016-01-04 MED ORDER — LIDOCAINE HCL 1 % IJ SOLN
INTRAMUSCULAR | Status: AC
  Filled 2016-01-04: qty 20

## 2016-01-04 MED ORDER — HYDROMORPHONE HCL 1 MG/ML IJ SOLN
0.2500 mg | INTRAMUSCULAR | Status: DC | PRN
  Administered 2016-01-04 (×3): 0.5 mg via INTRAVENOUS

## 2016-01-04 MED ORDER — FENTANYL CITRATE (PF) 100 MCG/2ML IJ SOLN
INTRAMUSCULAR | Status: AC
  Filled 2016-01-04: qty 2

## 2016-01-04 MED ORDER — SCOPOLAMINE 1 MG/3DAYS TD PT72
MEDICATED_PATCH | TRANSDERMAL | Status: AC
  Administered 2016-01-04: 1.5 mg via TRANSDERMAL
  Filled 2016-01-04: qty 1

## 2016-01-04 MED ORDER — LIDOCAINE HCL (CARDIAC) 20 MG/ML IV SOLN
INTRAVENOUS | Status: DC | PRN
  Administered 2016-01-04: 100 mg via INTRAVENOUS

## 2016-01-04 MED ORDER — PROMETHAZINE HCL 25 MG/ML IJ SOLN
INTRAMUSCULAR | Status: AC
  Filled 2016-01-04: qty 1

## 2016-01-04 MED ORDER — FENTANYL CITRATE (PF) 100 MCG/2ML IJ SOLN
INTRAMUSCULAR | Status: DC | PRN
  Administered 2016-01-04 (×2): 50 ug via INTRAVENOUS

## 2016-01-04 MED ORDER — MIDAZOLAM HCL 2 MG/2ML IJ SOLN
INTRAMUSCULAR | Status: AC
  Filled 2016-01-04: qty 2

## 2016-01-04 MED ORDER — HYDROMORPHONE HCL 1 MG/ML IJ SOLN
INTRAMUSCULAR | Status: AC
  Filled 2016-01-04: qty 1

## 2016-01-04 MED ORDER — SCOPOLAMINE 1 MG/3DAYS TD PT72
1.0000 | MEDICATED_PATCH | TRANSDERMAL | Status: DC
  Administered 2016-01-04: 1.5 mg via TRANSDERMAL

## 2016-01-04 MED ORDER — SODIUM CHLORIDE 0.9 % IR SOLN
Status: DC | PRN
  Administered 2016-01-04: 3000 mL

## 2016-01-04 MED ORDER — ONDANSETRON HCL 4 MG/2ML IJ SOLN
INTRAMUSCULAR | Status: DC | PRN
  Administered 2016-01-04: 4 mg via INTRAVENOUS

## 2016-01-04 MED ORDER — LIDOCAINE HCL 1 % IJ SOLN
INTRAMUSCULAR | Status: DC | PRN
  Administered 2016-01-04: 20 mL

## 2016-01-04 MED ORDER — KETOROLAC TROMETHAMINE 30 MG/ML IJ SOLN
INTRAMUSCULAR | Status: AC
  Filled 2016-01-04: qty 1

## 2016-01-04 SURGICAL SUPPLY — 19 items
ABLATOR ENDOMETRIAL BIPOLAR (ABLATOR) ×3 IMPLANT
BIPOLAR CUTTING LOOP 21FR (ELECTRODE)
CANISTER SUCT 3000ML (MISCELLANEOUS) ×3 IMPLANT
CATH ROBINSON RED A/P 16FR (CATHETERS) ×3 IMPLANT
CLOTH BEACON ORANGE TIMEOUT ST (SAFETY) ×3 IMPLANT
CONTAINER PREFILL 10% NBF 60ML (FORM) ×3 IMPLANT
ELECT REM PT RETURN 9FT ADLT (ELECTROSURGICAL)
ELECTRODE REM PT RTRN 9FT ADLT (ELECTROSURGICAL) IMPLANT
GLOVE BIOGEL PI IND STRL 7.0 (GLOVE) ×2 IMPLANT
GLOVE BIOGEL PI INDICATOR 7.0 (GLOVE) ×1
GLOVE ECLIPSE 7.0 STRL STRAW (GLOVE) ×6 IMPLANT
GOWN STRL REUS W/TWL LRG LVL3 (GOWN DISPOSABLE) ×9 IMPLANT
LOOP CUTTING BIPOLAR 21FR (ELECTRODE) IMPLANT
PACK VAGINAL MINOR WOMEN LF (CUSTOM PROCEDURE TRAY) ×3 IMPLANT
PAD OB MATERNITY 4.3X12.25 (PERSONAL CARE ITEMS) ×3 IMPLANT
TOWEL OR 17X24 6PK STRL BLUE (TOWEL DISPOSABLE) ×6 IMPLANT
TUBING AQUILEX INFLOW (TUBING) ×3 IMPLANT
TUBING AQUILEX OUTFLOW (TUBING) ×3 IMPLANT
WATER STERILE IRR 1000ML POUR (IV SOLUTION) ×3 IMPLANT

## 2016-01-04 NOTE — Anesthesia Procedure Notes (Signed)
Procedure Name: LMA Insertion Date/Time: 01/04/2016 12:20 PM Performed by: Georgeanne Nim Pre-anesthesia Checklist: Patient identified, Emergency Drugs available, Suction available, Patient being monitored and Timeout performed Patient Re-evaluated:Patient Re-evaluated prior to inductionOxygen Delivery Method: Circle system utilized Preoxygenation: Pre-oxygenation with 100% oxygen Intubation Type: IV induction Ventilation: Mask ventilation without difficulty LMA: LMA inserted LMA Size: 4.0 Placement Confirmation: positive ETCO2,  CO2 detector and breath sounds checked- equal and bilateral Tube secured with: Tape

## 2016-01-04 NOTE — Anesthesia Preprocedure Evaluation (Signed)
Anesthesia Evaluation  Patient identified by MRN, date of birth, ID band Patient awake    Reviewed: Allergy & Precautions, NPO status , Patient's Chart, lab work & pertinent test results  History of Anesthesia Complications Negative for: history of anesthetic complications  Airway Mallampati: III  TM Distance: >3 FB Neck ROM: Full    Dental no notable dental hx. (+) Dental Advisory Given   Pulmonary neg pulmonary ROS,    Pulmonary exam normal breath sounds clear to auscultation       Cardiovascular negative cardio ROS Normal cardiovascular exam Rhythm:Regular Rate:Normal     Neuro/Psych  Headaches, negative psych ROS   GI/Hepatic negative GI ROS, Neg liver ROS,   Endo/Other  Morbid obesity  Renal/GU negative Renal ROS  negative genitourinary   Musculoskeletal negative musculoskeletal ROS (+)   Abdominal   Peds negative pediatric ROS (+)  Hematology negative hematology ROS (+)   Anesthesia Other Findings   Reproductive/Obstetrics negative OB ROS                             Anesthesia Physical Anesthesia Plan  ASA: III  Anesthesia Plan: General   Post-op Pain Management:    Induction: Intravenous  Airway Management Planned: LMA  Additional Equipment:   Intra-op Plan:   Post-operative Plan: Extubation in OR  Informed Consent: I have reviewed the patients History and Physical, chart, labs and discussed the procedure including the risks, benefits and alternatives for the proposed anesthesia with the patient or authorized representative who has indicated his/her understanding and acceptance.   Dental advisory given  Plan Discussed with: CRNA  Anesthesia Plan Comments:         Anesthesia Quick Evaluation

## 2016-01-04 NOTE — Anesthesia Postprocedure Evaluation (Signed)
Anesthesia Post Note  Patient: [name]  Procedure(s) Performed: Procedure(s) (LRB): DILATATION & CURETTAGE/HYSTEROSCOPY WITH NOVASURE ABLATION (N/A)  Patient location during evaluation: PACU Anesthesia Type: General Level of consciousness: sedated and patient cooperative Pain management: pain level controlled Vital Signs Assessment: post-procedure vital signs reviewed and stable Respiratory status: spontaneous breathing Cardiovascular status: stable Anesthetic complications: no     Last Vitals:  Vitals:   01/04/16 1330 01/04/16 1345  BP: 119/76 122/73  Pulse: 72 68  Resp: 14 13  Temp:      Last Pain:  Vitals:   01/04/16 1345  TempSrc:   PainSc: 3    Pain Goal: Patients Stated Pain Goal: 2 (01/04/16 1250)               Nolon Nations

## 2016-01-04 NOTE — Discharge Instructions (Signed)
DISCHARGE INSTRUCTIONS: HYSTEROSCOPY / ENDOMETRIAL ABLATION The following instructions have been prepared to help you care for yourself upon your return home.  May Remove Scop patch on or before Sunday 01/07/16.  Wash your hands with soap and water after any contact with the patch.  May take Ibuprofen after 6:30pm.  May take stool softner while taking narcotic pain medication to prevent constipation.  Drink plenty of water.  Personal hygiene:  Use sanitary pads for vaginal drainage, not tampons.  Shower the day after your procedure.  NO tub baths, pools or Jacuzzis for 2-3 weeks.  Wipe front to back after using the bathroom.  Activity and limitations:  Do NOT drive or operate any equipment for 24 hours. The effects of anesthesia are still present and drowsiness may result.  Do NOT rest in bed all day.  Walking is encouraged.  Walk up and down stairs slowly.  You may resume your normal activity in one to two days or as indicated by your physician. Sexual activity: NO intercourse for at least 2 weeks after the procedure, or as indicated by your Doctor.  Diet: Eat a light meal as desired this evening. You may resume your usual diet tomorrow.  Return to Work: You may resume your work activities in one to two days or as indicated by Marine scientist.  What to expect after your surgery: Expect to have vaginal bleeding/discharge for 2-3 days and spotting for up to 10 days. It is not unusual to have soreness for up to 1-2 weeks. You may have a slight burning sensation when you urinate for the first day. Mild cramps may continue for a couple of days. You may have a regular period in 2-6 weeks.  Call your doctor for any of the following:  Excessive vaginal bleeding or clotting, saturating and changing one pad every hour.  Inability to urinate 6 hours after discharge from hospital.  Pain not relieved by pain medication.  Fever of 100.4 F or greater.  Unusual vaginal discharge or  odor.  Return to office _________________Call for an appointment ___________________ Patients signature: ______________________ Nurses signature ________________________  [facility] [phone]

## 2016-01-04 NOTE — Transfer of Care (Signed)
Immediate Anesthesia Transfer of Care Note  Patient: Pamela Ortega  Procedure(s) Performed: Procedure(s): DILATATION & CURETTAGE/HYSTEROSCOPY WITH NOVASURE ABLATION (N/A)  Patient Location: PACU  Anesthesia Type:General  Level of Consciousness: awake, alert , oriented and patient cooperative  Airway & Oxygen Therapy: Patient Spontanous Breathing and Patient connected to nasal cannula oxygen  Post-op Assessment: Report given to RN and Post -op Vital signs reviewed and stable  Post vital signs: Reviewed and stable  Last Vitals:  Vitals:   01/04/16 1104  BP: 136/84  Pulse: 96  Resp: 16  Temp: 36.8 C    Last Pain:  Vitals:   01/04/16 1104  TempSrc: Oral  PainSc: 2       Patients Stated Pain Goal: 2 ([id number])  Complications: No apparent anesthesia complications

## 2016-01-04 NOTE — H&P (Signed)
Pamela Ortega is an 45 y.o. white female who presents to the OR for a hysteroscopy/D&C/ablation for menorrhagia. She had an u/s which indicated that she may have 2 polyps and a possible submucous fibroid.  Chief Complaint: HPI:  Past Medical History:  Diagnosis Date  . Allergy   . Anemia   . 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 Headache    MIGRAINES    Past Surgical History:  Procedure Laterality Date  . KIDNEY DONATION      History reviewed. No pertinent family history. Social History:  reports that she has never smoked. She has never used smokeless tobacco. She reports that she drinks alcohol. She reports that she does not use drugs.  Allergies:  Allergies  Allergen Reactions  . Amoxicillin     STEVEN JOHNSON SYNDROME  . Cephalosporins     STEVEN JOHNSON SYNDROME  . Penicillins     STEVEN JOHNSON SYNDROME    Medications Prior to Admission  Medication Sig Dispense Refill  . acetaminophen (TYLENOL) 500 MG tablet Take 1,000 mg by mouth every 6 (six) hours as needed for mild pain (TWICE A DAY).     Marland Kitchen cetirizine (ZYRTEC) 10 MG tablet Take 10 mg by mouth daily.    Marland Kitchen ethynodiol-ethinyl estradiol (KELNOR,ZOVIA) 1-35 MG-MCG tablet Take 1 tablet by mouth daily.    Marland Kitchen ibuprofen (ADVIL,MOTRIN) 200 MG tablet Take 200-800 mg by mouth every 6 (six) hours as needed for headache or cramping.    . Prenatal Vit-Fe Fumarate-FA (MULTIVITAMIN-PRENATAL) 27-0.8 MG TABS tablet Take 1 tablet by mouth daily at 12 noon.         Blood pressure 136/84, pulse 96, temperature 98.3 F (36.8 C), temperature source Oral, resp. rate 16, height 5\' 3"  (1.6 m), weight 235 lb (106.6 kg), last menstrual period 12/10/2015, SpO2 100 %. General appearance: alert, cooperative and no distress Lungs: clear to auscultation bilaterally Heart: regular rate and rhythm, S1, S2 normal, no murmur, click, rub or gallop Abdomen: soft, non-tender; bowel sounds normal; no masses,  no organomegaly   Lab  Results  Component Value Date   WBC 9.1 01/04/2016   HGB 8.9 (L) 01/04/2016   HCT 29.3 (L) 01/04/2016   MCV 72.3 (L) 01/04/2016   PLT 300 01/04/2016   No results found for: PREGTESTUR, PREGSERUM, HCG, HCGQUANT    Patient Active Problem List   Diagnosis Date Noted  . H/O unilateral nephrectomy 07/22/2014  . Migraine 07/22/2014    Imp/ Menorrhagia, possible polyps and submucous fibroid Plan/ Hysteroscope/D&C/possible ablation  Bryttany Tortorelli E 01/04/2016, 12:03 PM

## 2016-01-05 ENCOUNTER — Encounter (HOSPITAL_COMMUNITY): Payer: Self-pay | Admitting: Obstetrics and Gynecology

## 2016-01-05 NOTE — Op Note (Signed)
NAME:  Pamela Ortega, Pamela Ortega NO.:  [id number]  MEDICAL RECORD NO.:  [id number]  LOCATION:  [facility]                          FACILITY:  [facility]  PHYSICIAN:  Freda Munro, M.D.    DATE OF BIRTH:  21-Jun-1970  DATE OF PROCEDURE:  01/04/2016 DATE OF DISCHARGE:  01/04/2016                              OPERATIVE REPORT   PREOPERATIVE DIAGNOSES: 1. Menorrhagia. 2. Suspected endometrial polyps. 3. Uterine fibroids.  POSTOPERATIVE DIAGNOSES: 1. Menorrhagia. 2. Suspected endometrial polyps. 3. Uterine fibroids with no evidence of a submucous fibroid.  SURGEON:  Freda Munro, M.D.  ANESTHESIA:  General and local.  ANTIBIOTICS:  None.  SPECIMENS:  Endometrial curettings, sent to pathology.  COMPLICATIONS:  None.  ESTIMATED BLOOD LOSS:  Minimal.  FINDINGS:  The patient had normal ostia.  She had no evidence of a submucous fibroid.  She had multiple uterine polyps, none of which looked malignant.  PROCEDURE IN DETAIL:  The patient was taken to the operating room, where she was placed in the dorsal supine position and general anesthetic was administered without difficulty.  She was then placed in the dorsal lithotomy position.  She was prepped and draped in the usual fashion for this procedure.  Her bladder was drained with a red rubber catheter.  A sterile speculum was placed in the vagina.  20 mL of 1% lidocaine was used for paracervical block.  A single-tooth tenaculum was applied to the anterior cervix.  The cervical os was serially dilated to a 65- [place].  The hysteroscope was advanced to the endocervical canal, which appeared to be normal on entering the uterine cavity, the findings were as noted above.  Following the removal of the scope, a D and C was performed.  Tissue was sent to pathology.  The uterus was sounded to 10 cm.  The intrauterine cavity was 6.5 cm.  The NovaSure device was placed into uterine cavity opened.  A seating procedure was performed, and  the width of the NovaSure was 4.2 cm.  A seal test was then performed and passed.  The device was turned on.  The ablation was performed without difficulty.  The device was removed.  The hysteroscope was then replaced and adequate burn was visualized.  The patient was then extubated and taken to the recovery room in stable condition.  Instrument and lap counts were correct x2. She will be discharged to home.  She will follow up in the office in 4 weeks.  She will take Motrin as needed.          ______________________________ Freda Munro, M.D.     MA/MEDQ  D:  01/04/2016  T:  01/05/2016  Job:  [id number]

## 2016-02-01 ENCOUNTER — Other Ambulatory Visit: Payer: Self-pay | Admitting: Obstetrics and Gynecology

## 2016-02-01 DIAGNOSIS — N393 Stress incontinence (female) (male): Secondary | ICD-10-CM | POA: Insufficient documentation

## 2016-02-02 LAB — CYTOLOGY - PAP

## 2017-01-16 DIAGNOSIS — J209 Acute bronchitis, unspecified: Secondary | ICD-10-CM | POA: Diagnosis not present

## 2017-02-04 DIAGNOSIS — Z1329 Encounter for screening for other suspected endocrine disorder: Secondary | ICD-10-CM | POA: Diagnosis not present

## 2017-02-04 DIAGNOSIS — Z01419 Encounter for gynecological examination (general) (routine) without abnormal findings: Secondary | ICD-10-CM | POA: Diagnosis not present

## 2017-02-04 DIAGNOSIS — Z131 Encounter for screening for diabetes mellitus: Secondary | ICD-10-CM | POA: Diagnosis not present

## 2017-02-04 DIAGNOSIS — Z1231 Encounter for screening mammogram for malignant neoplasm of breast: Secondary | ICD-10-CM | POA: Diagnosis not present

## 2017-02-04 DIAGNOSIS — Z113 Encounter for screening for infections with a predominantly sexual mode of transmission: Secondary | ICD-10-CM | POA: Diagnosis not present

## 2017-02-04 DIAGNOSIS — Z124 Encounter for screening for malignant neoplasm of cervix: Secondary | ICD-10-CM | POA: Diagnosis not present

## 2017-02-04 DIAGNOSIS — Z1389 Encounter for screening for other disorder: Secondary | ICD-10-CM | POA: Diagnosis not present

## 2017-02-05 MED FILL — FLUoxetine HCL 20 MG CAPS: 20 | 90 days supply | Qty: 90 | Fill #0

## 2017-02-07 MED FILL — FLUCONAZOLE 150 MG TABLET: 150 | 1 days supply | Qty: 1 | Fill #0

## 2017-03-14 MED FILL — KELNOR 1-35 28 TABLET: 1-35 | 84 days supply | Qty: 84 | Fill #0

## 2017-03-25 MED FILL — AZITHROMYCIN 250 MG TAB: 250 | 5 days supply | Qty: 6 | Fill #0

## 2017-03-25 MED FILL — BENZONATATE 200 MG CAP: 200 | 15 days supply | Qty: 30 | Fill #0

## 2017-03-25 MED FILL — predniSONE 10 MG TABS: 10 | 6 days supply | Qty: 14 | Fill #0

## 2017-06-05 MED FILL — KELNOR 1-35 28 TABLET: 1-35 | 84 days supply | Qty: 84 | Fill #1

## 2017-09-01 MED FILL — KELNOR 1-35 28 TABLET: 1-35 | 84 days supply | Qty: 84 | Fill #2

## 2017-09-29 MED FILL — PHENTERMINE 37.5 MG TABLET: 37.5 | 30 days supply | Qty: 30 | Fill #0

## 2017-11-17 MED FILL — SULFAMETHOXAZOLE-TMP DS TAB: 800-160 | 7 days supply | Qty: 14 | Fill #0

## 2017-11-24 MED FILL — KELNOR 1-35 28 TABLET: 1-35 | 56 days supply | Qty: 56 | Fill #3

## 2018-01-13 ENCOUNTER — Ambulatory Visit: Payer: No Typology Code available for payment source | Admitting: Podiatry

## 2018-01-13 ENCOUNTER — Ambulatory Visit (INDEPENDENT_AMBULATORY_CARE_PROVIDER_SITE_OTHER): Payer: No Typology Code available for payment source

## 2018-01-13 ENCOUNTER — Encounter: Payer: Self-pay | Admitting: Podiatry

## 2018-01-13 VITALS — BP 125/80 | HR 94 | Resp 16

## 2018-01-13 DIAGNOSIS — M778 Other enthesopathies, not elsewhere classified: Secondary | ICD-10-CM

## 2018-01-13 DIAGNOSIS — M779 Enthesopathy, unspecified: Secondary | ICD-10-CM | POA: Diagnosis not present

## 2018-01-13 DIAGNOSIS — S92215A Nondisplaced fracture of cuboid bone of left foot, initial encounter for closed fracture: Secondary | ICD-10-CM | POA: Diagnosis not present

## 2018-01-13 MED ORDER — METHYLPREDNISOLONE 4 MG PO TBPK: ORAL_TABLET | ORAL | 0 refills | Status: DC

## 2018-01-13 MED ORDER — MELOXICAM 15 MG PO TABS: 15.0000 mg | ORAL_TABLET | Freq: Every day | ORAL | 3 refills | Status: DC

## 2018-01-13 MED FILL — MELOXICAM 15 MG TABLET: 15 | 30 days supply | Qty: 30 | Fill #0

## 2018-01-13 MED FILL — KELNOR 1-35 28 TABLET: 1-35 | 84 days supply | Qty: 84 | Fill #0

## 2018-01-13 MED FILL — METHYLPREDNISOLONE 4 MG TAB: 4 | 6 days supply | Qty: 21 | Fill #0

## 2018-01-14 ENCOUNTER — Ambulatory Visit: Payer: Federal, State, Local not specified - PPO | Admitting: Podiatry

## 2018-01-14 NOTE — Progress Notes (Signed)
Subjective:  Patient ID: Pamela Ortega, female    DOB: April 30, 1970,  MRN: [id number] HPI Chief Complaint  Patient presents with  . Foot Pain    Dorsal/lateral/anterior ankle/achilles area left - aching and radiating pain x 1 week, no injury, swells, hurts to push foot into shoe, tried tylenol, advil, and naproxen - no help - RN at hospital works night shift  . New Patient (Initial Visit)    47 y.o. female presents with the above complaint.  ROS: She denies fever chills nausea vomiting muscle aches pains calf pain back pain chest pain shortness of breath.  Past Medical History:  Diagnosis Date  . Allergy   . Anemia   . 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 Headache    MIGRAINES   Past Surgical History:  Procedure Laterality Date  . DILITATION & CURRETTAGE/HYSTROSCOPY WITH NOVASURE ABLATION N/A 01/04/2016   Procedure: DILATATION & CURETTAGE/HYSTEROSCOPY WITH NOVASURE ABLATION;  Surgeon: Olga Millers, MD;  Location: [facility];  Service: Gynecology;  Laterality: N/A;  . KIDNEY DONATION      Current Outpatient Medications:  Marland Kitchen  Multiple Vitamin (MULTIVITAMIN) capsule, Take 1 capsule by mouth daily., Disp: , Rfl:  .  cetirizine (ZYRTEC) 10 MG tablet, Take 10 mg by mouth daily., Disp: , Rfl:  .  ethynodiol-ethinyl estradiol (KELNOR,ZOVIA) 1-35 MG-MCG tablet, Take 1 tablet by mouth daily., Disp: , Rfl:  .  meloxicam (MOBIC) 15 MG tablet, Take 1 tablet (15 mg total) by mouth daily., Disp: 30 tablet, Rfl: 3 .  methylPREDNISolone (MEDROL DOSEPAK) 4 MG TBPK tablet, 6 day dose pack - take as directed, Disp: 21 tablet, Rfl: 0  Allergies  Allergen Reactions  . Amoxicillin     STEVEN JOHNSON SYNDROME  . Cephalosporins     STEVEN JOHNSON SYNDROME  . Penicillins     STEVEN JOHNSON SYNDROME   Review of Systems Objective:   Vitals:   01/13/18 1520  BP: 125/80  Pulse: 94  Resp: 16    General: Well developed, nourished, in no acute distress, alert and oriented x3    Dermatological: Skin is warm, dry and supple bilateral. Nails x 10 are well maintained; remaining integument appears unremarkable at this time. There are no open sores, no preulcerative lesions, no rash or signs of infection present.  Vascular: Dorsalis Pedis artery and Posterior Tibial artery pedal pulses are 2/4 bilateral with immedate capillary fill time. Pedal hair growth present. No varicosities and no lower extremity edema present bilateral.   Neruologic: Grossly intact via light touch bilateral. Vibratory intact via tuning fork bilateral. Protective threshold with Semmes Wienstein monofilament intact to all pedal sites bilateral. Patellar and Achilles deep tendon reflexes 2+ bilateral. No Babinski or clonus noted bilateral.   Musculoskeletal: No gross boney pedal deformities bilateral. No pain, crepitus, or limitation noted with foot and ankle range of motion bilateral. Muscular strength 5/5 in all groups tested bilateral.  She has pain on palpation of the fourth and fifth metatarsals of the left foot most exquisitely noted at the cuboid.  Gait: Unassisted, Nonantalgic.    Radiographs:  Radiographs taken today demonstrate what appears to be a fracture of the cuboid nondisplaced non-comminuted appears to be more of a stress fracture.  Assessment & Plan:   Assessment: Stress fracture cuboid left foot.  Metatarsalgia.    Plan: Discussed etiology pathology conservative versus surgical therapies at this point I placed her in a cam boot provided her with a steroidal anti-inflammatory followed by a nonsteroidal  anti-inflammatory and will follow-up with her in about 4 weeks she will notify me if symptoms worsen.  Also placed her compression stocking.  Remember to ask how her trip to [place] went     Pamela Ortega, [place]

## 2018-02-17 ENCOUNTER — Encounter: Payer: Self-pay | Admitting: Podiatry

## 2018-02-17 ENCOUNTER — Ambulatory Visit: Payer: No Typology Code available for payment source | Admitting: Podiatry

## 2018-02-17 ENCOUNTER — Ambulatory Visit (INDEPENDENT_AMBULATORY_CARE_PROVIDER_SITE_OTHER): Payer: No Typology Code available for payment source

## 2018-02-17 DIAGNOSIS — S92215D Nondisplaced fracture of cuboid bone of left foot, subsequent encounter for fracture with routine healing: Secondary | ICD-10-CM

## 2018-02-17 NOTE — Progress Notes (Signed)
She presents today for follow-up of her stress fracture to the cuboid left states that is about 50% improved.  States that she continues to wear the cam walker.  Objective: Signs are stable she is alert and oriented x3 still has pain on palpation to the cuboid bone.  Radiographs today demonstrate a healing fracture of the cuboid nondisplaced non-comminuted appears to be stress fracture from the proximal margin.  Assessment: Healing stress fracture cuboid left.  Plan: Follow-up with me on an as-needed basis recommend she continue to wear the cam walker for at least another 2 weeks.

## 2018-10-21 MED FILL — PHENTERMINE 37.5 MG TABLET: 37.5 | 30 days supply | Qty: 30 | Fill #0

## 2018-10-23 MED FILL — OMEPRAZOLE 20 MG CPDR: 20 | 30 days supply | Qty: 30 | Fill #0

## 2018-10-23 MED FILL — SUMATRIPTAN SUCC 50 MG TAB: 50 | 27 days supply | Qty: 8 | Fill #0

## 2018-10-23 MED FILL — ONDANSETRON ODT 4 MG TABLET: 4 | 4 days supply | Qty: 12 | Fill #0

## 2018-10-27 MED FILL — BLISOVI FE 1/20 1-20 MG-MCG: 1-20 | 84 days supply | Qty: 84 | Fill #0

## 2018-11-24 MED FILL — BLISOVI FE 1/20 1-20 MG-MCG: 1-20 | 84 days supply | Qty: 84 | Fill #0

## 2018-11-24 MED FILL — OMEPRAZOLE 20 MG CAP: 20 | 30 days supply | Qty: 30 | Fill #1

## 2018-11-24 MED FILL — PHENTERMINE 37.5 MG TABLET: 37.5 | 30 days supply | Qty: 30 | Fill #1

## 2019-01-20 MED FILL — ONDANSETRON ODT 4 MG TABLET: 4 | 4 days supply | Qty: 12 | Fill #1

## 2019-01-20 MED FILL — OMEPRAZOLE 20 MG CAP: 20 | 30 days supply | Qty: 30 | Fill #2

## 2019-01-20 MED FILL — PHENTERMINE 37.5 MG TABLET: 37.5 | 30 days supply | Qty: 30 | Fill #2

## 2019-01-20 MED FILL — SUMATRIPTAN SUCC 50 MG TAB: 50 | 27 days supply | Qty: 8 | Fill #1

## 2019-02-26 MED FILL — BLISOVI FE 1/20 1-20 MG-MCG: 1-20 | 84 days supply | Qty: 84 | Fill #1

## 2019-04-10 MED FILL — OMEPRAZOLE 20 MG CAP: 20 | 30 days supply | Qty: 30 | Fill #4

## 2019-04-12 MED FILL — ONDANSETRON ODT 4 MG TABLET: 4 | 4 days supply | Qty: 12 | Fill #3

## 2019-04-12 MED FILL — SUMATRIPTAN SUCC 50 MG TAB: 50 | 27 days supply | Qty: 8 | Fill #3

## 2019-05-08 MED FILL — OMEPRAZOLE 20 MG CAP: 20 | 30 days supply | Qty: 30 | Fill #5

## 2019-05-10 MED FILL — ONDANSETRON ODT 4 MG TABLET: 4 | 4 days supply | Qty: 12 | Fill #4

## 2019-05-10 MED FILL — SUMATRIPTAN SUCC 50 MG TAB: 50 | 27 days supply | Qty: 8 | Fill #4

## 2019-05-19 MED FILL — BLISOVI FE 1/20 1-20 MG-MCG: 1-20 | 28 days supply | Qty: 28 | Fill #2

## 2019-05-24 MED FILL — ESCITALOPRAM 10 MG TABLET: 10 | 30 days supply | Qty: 60 | Fill #0

## 2020-02-03 ENCOUNTER — Other Ambulatory Visit (HOSPITAL_COMMUNITY): Payer: Self-pay

## 2020-02-03 MED FILL — BENZONATATE 100 MG CAPS: 100 | 7 days supply | Qty: 21 | Fill #0

## 2020-02-03 MED FILL — AZITHROMYCIN 250 MG TABLET: 250 | 5 days supply | Qty: 6 | Fill #0

## 2020-05-16 ENCOUNTER — Ambulatory Visit (INDEPENDENT_AMBULATORY_CARE_PROVIDER_SITE_OTHER): Payer: PRIVATE HEALTH INSURANCE | Admitting: Physician Assistant

## 2020-05-16 ENCOUNTER — Other Ambulatory Visit: Payer: Self-pay

## 2020-05-16 ENCOUNTER — Encounter: Payer: Self-pay | Admitting: Physician Assistant

## 2020-05-16 DIAGNOSIS — D225 Melanocytic nevi of trunk: Secondary | ICD-10-CM | POA: Diagnosis not present

## 2020-05-16 DIAGNOSIS — L700 Acne vulgaris: Secondary | ICD-10-CM

## 2020-05-16 DIAGNOSIS — D224 Melanocytic nevi of scalp and neck: Secondary | ICD-10-CM

## 2020-05-16 DIAGNOSIS — Z86018 Personal history of other benign neoplasm: Secondary | ICD-10-CM | POA: Diagnosis not present

## 2020-05-16 DIAGNOSIS — Z1283 Encounter for screening for malignant neoplasm of skin: Secondary | ICD-10-CM

## 2020-05-16 DIAGNOSIS — D2239 Melanocytic nevi of other parts of face: Secondary | ICD-10-CM | POA: Diagnosis not present

## 2020-05-16 DIAGNOSIS — D1800 Hemangioma unspecified site: Secondary | ICD-10-CM | POA: Diagnosis not present

## 2020-05-16 DIAGNOSIS — D485 Neoplasm of uncertain behavior of skin: Secondary | ICD-10-CM

## 2020-05-16 MED ORDER — WINLEVI 1 % EX CREA: 1.0000 "application " | TOPICAL_CREAM | Freq: Every morning | CUTANEOUS | 0 refills | Status: AC

## 2020-05-16 NOTE — Patient Instructions (Signed)

## 2020-05-24 NOTE — Progress Notes (Signed)
New Patient   Subjective  Pamela Ortega is a 50 y.o. female who presents for the following: Annual Exam (Moles on left side of face - mask irritates & back- wants checked).   The following portions of the chart were reviewed this encounter and updated as appropriate:  Allergies  Meds  Problems      Objective  Well appearing patient in no apparent distress; mood and affect are within normal limits.  A full examination was performed including scalp, head, eyes, ears, nose, lips, neck, chest, axillae, abdomen, back, buttocks, bilateral upper extremities, bilateral lower extremities, hands, feet, fingers, toes, fingernails, and toenails. All findings within normal limits unless otherwise noted below.  Objective  Left Upper Back: Multiple white scar- clear  Objective  Right Upper Back: Full body skin examination-   Objective  Left Lower Back: Multiple red raised papule  Objective  Left Buccal Cheek: Raised papule- no measurement or photo per KS  Objective  Right Buccal Cheek: Raised papule-no measurement or photo per ks  Objective  Left Anterior Neck: Raised papule-no measurement of photo per ks  Objective  Left Breast: Raised papule- no measurement or photo per ks  Objective  Head - Anterior (Face): Erythematous papules and pustules with comedones    Assessment & Plan  History of dysplastic nevus Left Upper Back  Yearly skin check  Encounter for screening for malignant neoplasm of skin Right Upper Back  Yearly skin check  Hemangioma, unspecified site Left Lower Back  Okay to leave if stable  Neoplasm of uncertain behavior of skin (4) Left Buccal Cheek  Skin / nail biopsy Type of biopsy: tangential   Informed consent: discussed and consent obtained   Timeout: patient name, date of birth, surgical site, and procedure verified   Procedure prep:  Patient was prepped and draped in usual sterile fashion (Non sterile) Prep type:   Chlorhexidine Anesthesia: the lesion was anesthetized in a standard fashion   Anesthetic:  1% lidocaine w/ epinephrine 1-100,000 local infiltration Instrument used: flexible razor blade   Outcome: patient tolerated procedure well   Post-procedure details: wound care instructions given   Additional details:  Cautery only  Specimen 1 - Surgical pathology Differential Diagnosis: r/o cn  Check Margins: No  Right Buccal Cheek  Skin / nail biopsy Type of biopsy: tangential   Informed consent: discussed and consent obtained   Timeout: patient name, date of birth, surgical site, and procedure verified   Anesthesia: the lesion was anesthetized in a standard fashion   Anesthetic:  1% lidocaine w/ epinephrine 1-100,000 local infiltration Instrument used: flexible razor blade   Hemostasis achieved with: aluminum chloride and electrodesiccation   Outcome: patient tolerated procedure well   Post-procedure details: wound care instructions given   Additional details:  Cautery only  Specimen 2 - Surgical pathology Differential Diagnosis: r/o cn  Check Margins: No  Left Anterior Neck  Skin / nail biopsy Type of biopsy: tangential   Informed consent: discussed and consent obtained   Timeout: patient name, date of birth, surgical site, and procedure verified   Procedure prep:  Patient was prepped and draped in usual sterile fashion (Non sterile) Prep type:  Chlorhexidine Anesthesia: the lesion was anesthetized in a standard fashion   Anesthetic:  1% lidocaine w/ epinephrine 1-100,000 local infiltration Instrument used: flexible razor blade   Outcome: patient tolerated procedure well   Post-procedure details: wound care instructions given   Additional details:  Cautery only  Specimen 3 - Surgical pathology Differential  Diagnosis: r/o cn  Check Margins: No  Left Breast  Skin / nail biopsy Type of biopsy: tangential   Informed consent: discussed and consent obtained   Timeout:  patient name, date of birth, surgical site, and procedure verified   Procedure prep:  Patient was prepped and draped in usual sterile fashion (Non sterile) Prep type:  Chlorhexidine Anesthesia: the lesion was anesthetized in a standard fashion   Anesthetic:  1% lidocaine w/ epinephrine 1-100,000 local infiltration Instrument used: flexible razor blade   Outcome: patient tolerated procedure well   Post-procedure details: wound care instructions given   Additional details:  Cautery only  Specimen 4 - Surgical pathology Differential Diagnosis: r/o cn  Check Margins: No  Acne vulgaris Head - Anterior (Face)    Ordered Medications: Clascoterone (WINLEVI) 1 % CREA     I, SHEFFIELD,KELLI, PA-C, have reviewed all documentation for this visit. The documentation on 05/24/20 for the exam, diagnosis, procedures, and orders are all accurate and complete.
